# Patient Record
Sex: Female | Born: 1990 | Hispanic: Yes | Smoking: Never smoker
Health system: Southern US, Community
[De-identification: ages and names within clinical notes are randomized; demographics above are authoritative.]

## PROBLEM LIST (undated history)

## (undated) ENCOUNTER — Ambulatory Visit: Discharge status: Absent without leave | Payer: Managed Care, Other (non HMO)

## (undated) DIAGNOSIS — Z789 Other specified health status: Secondary | ICD-10-CM

## (undated) DIAGNOSIS — O24419 Gestational diabetes mellitus in pregnancy, unspecified control: Secondary | ICD-10-CM

## (undated) HISTORY — PX: NO PAST SURGERIES: SHX2092

## (undated) HISTORY — DX: Gestational diabetes mellitus in pregnancy, unspecified control: O24.419

---

## 2012-11-08 LAB — OB RESULTS CONSOLE GC/CHLAMYDIA
Chlamydia: NEGATIVE
GC PROBE AMP, GENITAL: NEGATIVE

## 2012-11-08 LAB — OB RESULTS CONSOLE ANTIBODY SCREEN: Antibody Screen: NEGATIVE

## 2012-11-08 LAB — OB RESULTS CONSOLE ABO/RH: RH TYPE: POSITIVE

## 2012-11-08 LAB — OB RESULTS CONSOLE HEPATITIS B SURFACE ANTIGEN: Hepatitis B Surface Ag: NEGATIVE

## 2012-11-08 LAB — OB RESULTS CONSOLE RUBELLA ANTIBODY, IGM: RUBELLA: IMMUNE

## 2012-11-08 LAB — OB RESULTS CONSOLE RPR: RPR: NONREACTIVE

## 2012-11-08 LAB — OB RESULTS CONSOLE HIV ANTIBODY (ROUTINE TESTING): HIV: NONREACTIVE

## 2012-12-20 ENCOUNTER — Inpatient Hospital Stay (HOSPITAL_COMMUNITY): Admission: AD | Admit: 2012-12-20 | Payer: Self-pay | Source: Ambulatory Visit | Admitting: Obstetrics & Gynecology

## 2013-04-04 NOTE — L&D Delivery Note (Signed)
Delivery Note At 4:28 PM a healthy female was delivered via Vaginal, Spontaneous Delivery (Presentation: ; Occiput Anterior).  APGAR: 8, 9; weight 8 lb 4.5 oz (3755 g).   Placenta status: Intact, Spontaneous.  Cord: 3 vessels with the following complications: None.  Cord pH: na  Anesthesia: Epidural  Episiotomy: None Lacerations: 1st degree Suture Repair: 3.0 monocryl Est. Blood Loss (mL): 500  Upon arrival patient was complete and pushing. She pushed with good maternal effort to deliver a healthy girl. Baby delivered without difficulty, was noted to have good tone and place on maternal abdomen for oral suctioning, drying and stimulation. Delayed cord clamping performed and cut by FOB. Placenta delivered intact with 3V cord. Vaginal canal and perineum was inspected and 1st degree vaginal laceration was found and repaired in the usual fashion by Dr Gayla DossJoyner which was hemostatic on completion. Pitocin was started and uterus massaged until bleeding slowed. Counts of sharps, instruments, and lap pads were all correct.    Mom to postpartum.  Baby to Couplet care / Skin to Skin.  Wenda LowJoyner, James 06/20/2013, 5:28 PM   I was present for and supervised the delivery of this newborn. I agree with above.   Chayanne Speir, Redmond BasemanKELI L, MD

## 2013-05-20 LAB — OB RESULTS CONSOLE GBS: GBS: NEGATIVE

## 2013-05-20 LAB — OB RESULTS CONSOLE GC/CHLAMYDIA
Chlamydia: NEGATIVE
Gonorrhea: NEGATIVE

## 2013-06-17 ENCOUNTER — Other Ambulatory Visit (HOSPITAL_COMMUNITY): Payer: Self-pay | Admitting: Nurse Practitioner

## 2013-06-17 DIAGNOSIS — O480 Post-term pregnancy: Secondary | ICD-10-CM

## 2013-06-19 ENCOUNTER — Inpatient Hospital Stay (HOSPITAL_COMMUNITY)
Admission: AD | Admit: 2013-06-19 | Discharge: 2013-06-21 | DRG: 775 | Disposition: A | Discharge status: Home / Self Care | Payer: Medicaid Other | Source: Ambulatory Visit | Attending: Obstetrics & Gynecology | Admitting: Obstetrics & Gynecology

## 2013-06-19 ENCOUNTER — Encounter (HOSPITAL_COMMUNITY): Payer: Self-pay | Admitting: *Deleted

## 2013-06-19 ENCOUNTER — Ambulatory Visit (HOSPITAL_COMMUNITY)
Admission: RE | Admit: 2013-06-19 | Discharge: 2013-06-19 | Disposition: A | Discharge status: Home / Self Care | Payer: Medicaid Other | Source: Ambulatory Visit | Attending: Nurse Practitioner | Admitting: Nurse Practitioner

## 2013-06-19 DIAGNOSIS — Z3689 Encounter for other specified antenatal screening: Secondary | ICD-10-CM | POA: Insufficient documentation

## 2013-06-19 DIAGNOSIS — O480 Post-term pregnancy: Secondary | ICD-10-CM | POA: Insufficient documentation

## 2013-06-19 HISTORY — DX: Other specified health status: Z78.9

## 2013-06-19 LAB — CBC
HCT: 41.1 % (ref 36.0–46.0)
Hemoglobin: 14.9 g/dL (ref 12.0–15.0)
MCH: 30.5 pg (ref 26.0–34.0)
MCHC: 36.3 g/dL — AB (ref 30.0–36.0)
MCV: 84.2 fL (ref 78.0–100.0)
Platelets: 139 10*3/uL — ABNORMAL LOW (ref 150–400)
RBC: 4.88 MIL/uL (ref 3.87–5.11)
RDW: 13.6 % (ref 11.5–15.5)
WBC: 7.6 10*3/uL (ref 4.0–10.5)

## 2013-06-19 LAB — ABO/RH: ABO/RH(D): A POS

## 2013-06-19 LAB — RPR: RPR: NONREACTIVE

## 2013-06-19 LAB — TYPE AND SCREEN
ABO/RH(D): A POS
Antibody Screen: NEGATIVE

## 2013-06-19 MED ORDER — TERBUTALINE SULFATE 1 MG/ML IJ SOLN: 0.2500 mg | Freq: Once | INTRAMUSCULAR | Status: AC | PRN

## 2013-06-19 MED ORDER — IBUPROFEN 600 MG PO TABS: 600.0000 mg | ORAL_TABLET | Freq: Four times a day (QID) | ORAL | Status: DC | PRN

## 2013-06-19 MED ORDER — ONDANSETRON HCL 4 MG/2ML IJ SOLN: 4.0000 mg | Freq: Four times a day (QID) | INTRAMUSCULAR | Status: DC | PRN

## 2013-06-19 MED ORDER — LIDOCAINE HCL (PF) 1 % IJ SOLN
30.0000 mL | INTRAMUSCULAR | Status: DC | PRN
  Filled 2013-06-19: qty 30

## 2013-06-19 MED ORDER — OXYTOCIN 40 UNITS IN LACTATED RINGERS INFUSION - SIMPLE MED
1.0000 m[IU]/min | INTRAVENOUS | Status: DC
  Administered 2013-06-19: 2 m[IU]/min via INTRAVENOUS
  Filled 2013-06-19: qty 1000

## 2013-06-19 MED ORDER — LACTATED RINGERS IV SOLN: 500.0000 mL | INTRAVENOUS | Status: DC | PRN

## 2013-06-19 MED ORDER — OXYCODONE-ACETAMINOPHEN 5-325 MG PO TABS: 1.0000 | ORAL_TABLET | ORAL | Status: DC | PRN

## 2013-06-19 MED ORDER — OXYTOCIN 40 UNITS IN LACTATED RINGERS INFUSION - SIMPLE MED
62.5000 mL/h | INTRAVENOUS | Status: DC
  Administered 2013-06-20: 62.5 mL/h via INTRAVENOUS

## 2013-06-19 MED ORDER — FENTANYL CITRATE 0.05 MG/ML IJ SOLN
100.0000 ug | INTRAMUSCULAR | Status: DC | PRN
  Administered 2013-06-20: 100 ug via INTRAVENOUS
  Filled 2013-06-19: qty 2

## 2013-06-19 MED ORDER — MISOPROSTOL 25 MCG QUARTER TABLET
25.0000 ug | ORAL_TABLET | ORAL | Status: DC | PRN
  Administered 2013-06-19: 25 ug via VAGINAL
  Filled 2013-06-19: qty 1
  Filled 2013-06-19: qty 0.25

## 2013-06-19 MED ORDER — LACTATED RINGERS IV SOLN
INTRAVENOUS | Status: DC
  Administered 2013-06-19 – 2013-06-20 (×4): via INTRAVENOUS

## 2013-06-19 MED ORDER — OXYTOCIN BOLUS FROM INFUSION
500.0000 mL | INTRAVENOUS | Status: DC
  Administered 2013-06-20: 500 mL via INTRAVENOUS

## 2013-06-19 MED ORDER — ACETAMINOPHEN 325 MG PO TABS
650.0000 mg | ORAL_TABLET | ORAL | Status: DC | PRN
  Administered 2013-06-20: 650 mg via ORAL
  Filled 2013-06-19: qty 2

## 2013-06-19 MED ORDER — CITRIC ACID-SODIUM CITRATE 334-500 MG/5ML PO SOLN: 30.0000 mL | ORAL | Status: DC | PRN

## 2013-06-19 NOTE — H&P (Signed)
Dorothy [name] is a 23 y.o. female G1P0 with IUP at [redacted]w[redacted]d presenting for IOL due to non-reassuring BPP 4/8. Pt states she has been having irregular contractions, associated with none vaginal bleeding.  Membranes are intact, with active fetal movement.    [facility]  Prenatal History/Complications: Non-reassuring BPP 4/8 on 3/18  Past Medical History: Past Medical History  Diagnosis Date  . Medical history non-contributory     Past Surgical History: Past Surgical History  Procedure Laterality Date  . No past surgeries      Obstetrical History: OB History   Grav Para Term Preterm Abortions TAB SAB Ect Mult Living   1               Social History: History   Social History  . Marital Status: Single    Spouse Name: N/A    Number of Children: N/A  . Years of Education: N/A   Social History Main Topics  . Smoking status: Never Smoker   . Smokeless tobacco: Never Used  . Alcohol Use: No  . Drug Use: No  . Sexual Activity: Yes   Other Topics Concern  . None   Social History Narrative  . None    Family History: No family history on file.  Allergies: No Known Allergies  Prescriptions prior to admission  Medication Sig Dispense Refill  . Prenatal Vit-Fe Fumarate-FA (PRENATAL MULTIVITAMIN) TABS tablet Take 1 tablet by mouth daily at 12 noon.        Review of Systems: Negative unless otherwise stated in History above  Physicial Temperature 98.1 F (36.7 C), temperature source Oral, resp. rate 20, height 5' (1.524 m), weight 76.204 kg (168 lb). General appearance: alert, cooperative and no distress Lungs: clear to auscultation bilaterally Heart: regular rate and rhythm Abdomen: soft, non-tender; bowel sounds normal Extremities: Homans sign is negative, no sign of DVT Presentation: cephalic Fetal monitoringBaseline: 140 bpm, Variability: Good {> 6 bpm), Accelerations: Reactive and Decelerations: Absent Uterine activity: Irregular  contractions  Dilation: 1 Effacement (%): 50 Station: -3 Exam by:: Dr Ike Bene  Prenatal labs: ABO, Rh: A/Positive/-- (08/07 0000) Antibody: Negative (08/07 0000) Rubella:   RPR: Nonreactive (08/07 0000)  HBsAg: Negative (08/07 0000)  HIV: Non-reactive (08/07 0000)  GBS: Negative (02/16 0000)  GTT: wnl  Prenatal Transfer Tool  Maternal Diabetes: No Genetic Screening: Declined Maternal Ultrasounds/Referrals: Normal Fetal Ultrasounds or other Referrals:  None Maternal Substance Abuse:  No Significant Maternal Medications:  None Significant Maternal Lab Results: Lab values include: Group B Strep negative  Results for orders placed during the hospital encounter of 06/19/13 (from the past 24 hour(s))  CBC   Collection Time    06/19/13 11:25 AM      Result Value Ref Range   WBC 7.6  4.0 - 10.5 K/uL   RBC 4.88  3.87 - 5.11 MIL/uL   Hemoglobin 14.9  12.0 - 15.0 g/dL   HCT 16.1  09.6 - 04.5 %   MCV 84.2  78.0 - 100.0 fL   MCH 30.5  26.0 - 34.0 pg   MCHC 36.3 (*) 30.0 - 36.0 g/dL   RDW 40.9  81.1 - 91.4 %   Platelets 139 (*) 150 - 400 K/uL    Assessment: Dorothy [name] is a 23 y.o. G1P0 at [redacted]w[redacted]d by here for IOL due to [facility] 4/8. FHT Cat on arrival; will continue to monitor.   #Labor:expectant management #Pain: Epidural on request #FWB: Cat 1; Continuous monitoring due to  BPP 4/8 on 06/19/13 #ID:  GBS negative #Feeding: Breast #MOC:Unsure #Circ:  NA - female  Wenda LowJames Joyner MD [facility] PGY-1 06/19/2013, 12:38 PM  I spoke with and examined patient and agree with resident's note and plan of care.  Tawana ScaleMichael Ryan Vibha Ferdig, MD OB Fellow 06/19/2013 7:27 PM

## 2013-06-19 NOTE — Progress Notes (Signed)
Dorothy SpecterMaria [name] is a 23 y.o. G1P0 at 1542w4d by ultrasound [facility]  Subjective: Doing well; No complaints;   Objective: BP 111/66  Pulse 91  Temp(Src) 97.7 F (36.5 C) (Oral)  Resp 20  Ht 5' (1.524 m)  Wt 76.204 kg (168 lb)  BMI 32.81 kg/m2     FHT:  FHR: 140 bpm, variability: moderate,  accelerations:  Abscent,  decelerations:  Absent Dilation: 4.5 Effacement (%): 60 Cervical Position: Posterior Station: -3;-2 Presentation: Vertex Exam by:: Dr Margo AyeHall  Labs: Lab Results  Component Value Date   WBC 7.6 06/19/2013   HGB 14.9 06/19/2013   HCT 41.1 06/19/2013   MCV 84.2 06/19/2013   PLT 139* 06/19/2013    Assessment / Plan: IOL for BPP 4/8 - NST reactive  Labor: continuing PIT s/p FB & cytotec x 1;  Preeclampsia:  na Fetal Wellbeing:  Category I Pain Control:  Labor support without medications I/D:  n/a Anticipated MOD:  NSVD  [name], Avri Paiva C 06/19/2013, 11:52 PM

## 2013-06-19 NOTE — Progress Notes (Signed)
Dorothy SpecterMaria [name] is a 23 y.o. G1P0 at 5361w4d by ultrasound [facility]  Subjective: Doing well; No complaints  Objective: BP 120/76  Pulse 84  Temp(Src) 98.2 F (36.8 C) (Axillary)  Resp 20  Ht 5' (1.524 m)  Wt 76.204 kg (168 lb)  BMI 32.81 kg/m2     FHT:  FHR: 140 bpm, variability: moderate,  accelerations:  Abscent,  decelerations:  Absent UC:   regular, every 3 minutes SVE:   Dilation: 5 Effacement (%): 60 Station: -2 Exam by:: Dr Ike Benedom  Labs: Lab Results  Component Value Date   WBC 7.6 06/19/2013   HGB 14.9 06/19/2013   HCT 41.1 06/19/2013   MCV 84.2 06/19/2013   PLT 139* 06/19/2013    Assessment / Plan: IOL for BPP 4/8 - NST reactive  Labor: Progressing normally and Starting PIT s/p FB & cytotec x 1 Preeclampsia:  na Fetal Wellbeing:  Category I Pain Control:  Labor support without medications I/D:  n/a Anticipated MOD:  NSVD  Dorothy [name], Dorothy [name] 06/19/2013, 7:08 PM

## 2013-06-19 NOTE — Progress Notes (Signed)
Dorothy [name] is a 23 y.o. G1P0 at 4979w4d by ultrasound admitted for [facility]  Subjective: Doing well; Increase pressure with contractions, but not asking for pain medications at this time.   Objective: BP 120/76  Pulse 84  Temp(Src) 98.2 F (36.8 C) (Axillary)  Resp 20  Ht 5' (1.524 m)  Wt 76.204 kg (168 lb)  BMI 32.81 kg/m2     FHT:  FHR: 150 bpm, variability: moderate,  accelerations:  Present,  decelerations:  Absent UC:   regular, every 3 minutes SVE:   Dilation: 1 Effacement (%): 50 Station: -3 Exam by:: Ace GinsL. Cresenzo, RN  Labs: Lab Results  Component Value Date   WBC 7.6 06/19/2013   HGB 14.9 06/19/2013   HCT 41.1 06/19/2013   MCV 84.2 06/19/2013   PLT 139* 06/19/2013    Assessment / Plan: IOL for BPP 4/8 - NST reactive  Labor: Progressing normally and FB placed s/p cytotec x 1 Preeclampsia:  na Fetal Wellbeing:  Category I Pain Control:  Labor support without medications I/D:  n/a Anticipated MOD:  NSVD  Dorothy [name], Dorothy [name] 06/19/2013, 6:16 PM

## 2013-06-20 ENCOUNTER — Inpatient Hospital Stay (HOSPITAL_COMMUNITY): Payer: Medicaid Other | Admitting: Anesthesiology

## 2013-06-20 ENCOUNTER — Encounter (HOSPITAL_COMMUNITY): Payer: Self-pay | Admitting: *Deleted

## 2013-06-20 ENCOUNTER — Encounter (HOSPITAL_COMMUNITY): Payer: Medicaid Other | Admitting: Anesthesiology

## 2013-06-20 LAB — CBC
HCT: 39.1 % (ref 36.0–46.0)
HEMATOCRIT: 38.3 % (ref 36.0–46.0)
Hemoglobin: 14.2 g/dL (ref 12.0–15.0)
Hemoglobin: 13.9 g/dL (ref 12.0–15.0)
MCH: 30.3 pg (ref 26.0–34.0)
MCH: 30.6 pg (ref 26.0–34.0)
MCHC: 36.3 g/dL — AB (ref 30.0–36.0)
MCHC: 36.3 g/dL — AB (ref 30.0–36.0)
MCV: 83.5 fL (ref 78.0–100.0)
MCV: 84.4 fL (ref 78.0–100.0)
PLATELETS: 121 10*3/uL — AB (ref 150–400)
Platelets: 141 10*3/uL — ABNORMAL LOW (ref 150–400)
RBC: 4.68 MIL/uL (ref 3.87–5.11)
RBC: 4.54 MIL/uL (ref 3.87–5.11)
RDW: 13.7 % (ref 11.5–15.5)
RDW: 13.6 % (ref 11.5–15.5)
WBC: 10 10*3/uL (ref 4.0–10.5)
WBC: 18.2 10*3/uL — ABNORMAL HIGH (ref 4.0–10.5)

## 2013-06-20 MED ORDER — PHENYLEPHRINE 40 MCG/ML (10ML) SYRINGE FOR IV PUSH (FOR BLOOD PRESSURE SUPPORT)
80.0000 ug | PREFILLED_SYRINGE | INTRAVENOUS | Status: DC | PRN
Start: 2013-06-20 — End: 2013-06-20
  Filled 2013-06-20: qty 2

## 2013-06-20 MED ORDER — LACTATED RINGERS IV SOLN
500.0000 mL | Freq: Once | INTRAVENOUS | Status: AC
  Administered 2013-06-20: 500 mL via INTRAVENOUS

## 2013-06-20 MED ORDER — GENTAMICIN SULFATE 40 MG/ML IJ SOLN
140.0000 mg | Freq: Three times a day (TID) | INTRAVENOUS | Status: DC
  Filled 2013-06-20 (×2): qty 3.5

## 2013-06-20 MED ORDER — WITCH HAZEL-GLYCERIN EX PADS: 1.0000 "application " | MEDICATED_PAD | CUTANEOUS | Status: DC | PRN

## 2013-06-20 MED ORDER — SIMETHICONE 80 MG PO CHEW: 80.0000 mg | CHEWABLE_TABLET | ORAL | Status: DC | PRN

## 2013-06-20 MED ORDER — PRENATAL MULTIVITAMIN CH
1.0000 | ORAL_TABLET | Freq: Every day | ORAL | Status: DC
  Administered 2013-06-21: 1 via ORAL
  Filled 2013-06-20: qty 1

## 2013-06-20 MED ORDER — FENTANYL 2.5 MCG/ML BUPIVACAINE 1/10 % EPIDURAL INFUSION (WH - ANES)
INTRAMUSCULAR | Status: DC | PRN
  Administered 2013-06-20: 14 mL/h via EPIDURAL

## 2013-06-20 MED ORDER — EPHEDRINE 5 MG/ML INJ
10.0000 mg | INTRAVENOUS | Status: DC | PRN
Start: 2013-06-20 — End: 2013-06-20
  Filled 2013-06-20: qty 2
  Filled 2013-06-20: qty 4

## 2013-06-20 MED ORDER — LIDOCAINE HCL (PF) 1 % IJ SOLN
INTRAMUSCULAR | Status: DC | PRN
  Administered 2013-06-20 (×2): 4 mL

## 2013-06-20 MED ORDER — DIPHENHYDRAMINE HCL 50 MG/ML IJ SOLN: 12.5000 mg | INTRAMUSCULAR | Status: DC | PRN

## 2013-06-20 MED ORDER — DIPHENHYDRAMINE HCL 25 MG PO CAPS: 25.0000 mg | ORAL_CAPSULE | Freq: Four times a day (QID) | ORAL | Status: DC | PRN

## 2013-06-20 MED ORDER — PHENYLEPHRINE 40 MCG/ML (10ML) SYRINGE FOR IV PUSH (FOR BLOOD PRESSURE SUPPORT)
80.0000 ug | PREFILLED_SYRINGE | INTRAVENOUS | Status: DC | PRN
  Filled 2013-06-20: qty 2
  Filled 2013-06-20: qty 10

## 2013-06-20 MED ORDER — BENZOCAINE-MENTHOL 20-0.5 % EX AERO
1.0000 "application " | INHALATION_SPRAY | CUTANEOUS | Status: DC | PRN
  Filled 2013-06-20: qty 56

## 2013-06-20 MED ORDER — EPHEDRINE 5 MG/ML INJ
10.0000 mg | INTRAVENOUS | Status: DC | PRN
  Filled 2013-06-20: qty 2

## 2013-06-20 MED ORDER — SODIUM CHLORIDE 0.9 % IV SOLN
2.0000 g | Freq: Four times a day (QID) | INTRAVENOUS | Status: DC
  Filled 2013-06-20 (×3): qty 2000

## 2013-06-20 MED ORDER — TETANUS-DIPHTH-ACELL PERTUSSIS 5-2.5-18.5 LF-MCG/0.5 IM SUSP: 0.5000 mL | Freq: Once | INTRAMUSCULAR | Status: DC

## 2013-06-20 MED ORDER — ZOLPIDEM TARTRATE 5 MG PO TABS: 5.0000 mg | ORAL_TABLET | Freq: Every evening | ORAL | Status: DC | PRN

## 2013-06-20 MED ORDER — SENNOSIDES-DOCUSATE SODIUM 8.6-50 MG PO TABS
2.0000 | ORAL_TABLET | ORAL | Status: DC
  Filled 2013-06-20: qty 2

## 2013-06-20 MED ORDER — IBUPROFEN 600 MG PO TABS
600.0000 mg | ORAL_TABLET | Freq: Four times a day (QID) | ORAL | Status: DC
Start: 2013-06-20 — End: 2013-06-21
  Administered 2013-06-20 – 2013-06-21 (×5): 600 mg via ORAL
  Filled 2013-06-20 (×5): qty 1

## 2013-06-20 MED ORDER — FENTANYL 2.5 MCG/ML BUPIVACAINE 1/10 % EPIDURAL INFUSION (WH - ANES)
14.0000 mL/h | INTRAMUSCULAR | Status: DC | PRN
  Administered 2013-06-20: 14 mL/h via EPIDURAL
  Filled 2013-06-20: qty 125

## 2013-06-20 MED ORDER — FENTANYL 2.5 MCG/ML BUPIVACAINE 1/10 % EPIDURAL INFUSION (WH - ANES)
14.0000 mL/h | INTRAMUSCULAR | Status: DC | PRN
  Filled 2013-06-20: qty 125

## 2013-06-20 MED ORDER — OXYCODONE-ACETAMINOPHEN 5-325 MG PO TABS: 1.0000 | ORAL_TABLET | ORAL | Status: DC | PRN

## 2013-06-20 MED ORDER — DIBUCAINE 1 % RE OINT: 1.0000 "application " | TOPICAL_OINTMENT | RECTAL | Status: DC | PRN

## 2013-06-20 MED ORDER — LANOLIN HYDROUS EX OINT: TOPICAL_OINTMENT | CUTANEOUS | Status: DC | PRN

## 2013-06-20 MED ORDER — ONDANSETRON HCL 4 MG/2ML IJ SOLN: 4.0000 mg | INTRAMUSCULAR | Status: DC | PRN

## 2013-06-20 MED ORDER — ONDANSETRON HCL 4 MG PO TABS: 4.0000 mg | ORAL_TABLET | ORAL | Status: DC | PRN

## 2013-06-20 NOTE — Progress Notes (Signed)
Dorothy SpecterMaria [name] is a 23 y.o. G1P0 at 3334w4d by ultrasound admitted [facility]  Subjective: Has felt increasing pain, requested fentanyl and will probably want epidural too  Objective: BP 114/78  Pulse 99  Temp(Src) 97.7 F (36.5 C) (Oral)  Resp 20  Ht 5' (1.524 m)  Wt 76.204 kg (168 lb)  BMI 32.81 kg/m2     FHT:  FHR: 150 bpm, variability: moderate,  accelerations:  Present,  decelerations:  Absent Dilation: 5 Effacement (%): 80 Cervical Position: Middle Station: -2 Presentation: Vertex Exam by:: Larose KellsK Fraley RN  Labs: Lab Results  Component Value Date   WBC 10.0 06/20/2013   HGB 14.2 06/20/2013   HCT 39.1 06/20/2013   MCV 83.5 06/20/2013   PLT 121* 06/20/2013    Assessment / Plan: IOL for BPP 4/8 - NST reactive  Labor: continuing PIT s/p FB & cytotec x 1; Ctx more regular, 90sec; cervical protraction of dilation Preeclampsia:  no signs or symptoms of toxicity Fetal Wellbeing:  Category I Pain Control:  Labor support without medications I/D:  GBS neg Anticipated MOD:  NSVD  [name], Vrinda Heckstall C 06/20/2013, 1:34 AM

## 2013-06-20 NOTE — Anesthesia Procedure Notes (Signed)
Epidural Patient location during procedure: OB Start time: 06/20/2013 3:01 AM End time: 06/20/2013 3:16 AM  Staffing Anesthesiologist: Lewie LoronGERMEROTH, Aline Wesche R Performed by: anesthesiologist   Preanesthetic Checklist Completed: patient identified, pre-op evaluation, timeout performed, IV checked, risks and benefits discussed and monitors and equipment checked  Epidural Patient position: sitting Prep: site prepped and draped and DuraPrep Patient monitoring: heart rate Approach: midline Location: L3-L4 Injection technique: LOR air and LOR saline  Needle:  Needle type: Tuohy  Needle gauge: 17 G Needle length: 9 cm Needle insertion depth: 6 cm Catheter type: closed end flexible Catheter size: 19 Gauge Catheter at skin depth: 12 cm Test dose: negative  Assessment Sensory level: T8 Events: blood aspirated, injection not painful, no injection resistance, negative IV test and no paresthesia  Additional Notes 1st attempt at L2-3, blood aspirated. 2nd, L3-4 without issue.Reason for block:procedure for pain

## 2013-06-20 NOTE — Anesthesia Preprocedure Evaluation (Signed)
Anesthesia Evaluation  Patient identified by MRN, date of birth, ID band Patient awake    Reviewed: Allergy & Precautions, H&P , NPO status , Patient's Chart, lab work & pertinent test results  Airway Mallampati: II TM Distance: >3 FB     Dental  (+) Dental Advisory Given   Pulmonary neg pulmonary ROS,          Cardiovascular negative cardio ROS  Rhythm:Regular     Neuro/Psych negative neurological ROS  negative psych ROS   GI/Hepatic negative GI ROS, Neg liver ROS,   Endo/Other  negative endocrine ROS  Renal/GU negative Renal ROS     Musculoskeletal negative musculoskeletal ROS (+)   Abdominal   Peds  Hematology negative hematology ROS (+)   Anesthesia Other Findings   Reproductive/Obstetrics (+) Pregnancy                           Anesthesia Physical Anesthesia Plan  ASA: II  Anesthesia Plan: Epidural   Post-op Pain Management:    Induction:   Airway Management Planned:   Additional Equipment:   Intra-op Plan:   Post-operative Plan:   Informed Consent: I have reviewed the patients History and Physical, chart, labs and discussed the procedure including the risks, benefits and alternatives for the proposed anesthesia with the patient or authorized representative who has indicated his/her understanding and acceptance.     Plan Discussed with:   Anesthesia Plan Comments:         Anesthesia Quick Evaluation  

## 2013-06-20 NOTE — Progress Notes (Signed)
Dorothy [name] is a 23 y.o. G1P0 at 3918w4d by ultrasound admitted for induction of labor [facility]  Subjective: Received epidural, and is feeling well  Objective: BP 114/65  Pulse 117  Temp(Src) 99 F (37.2 [name]) (Oral)  Resp 18  Ht 5' (1.524 m)  Wt 76.204 kg (168 lb)  BMI 32.81 kg/m2  SpO2 96%     FHT:  FHR: 140 bpm, variability: moderate,  accelerations:  Present,  decelerations:  Absent Dilation: 7 Effacement (%): 100 Cervical Position: Middle Station: -1;0 Presentation: Vertex Exam by:: Larose KellsK Fraley RN  Labs: Lab Results  Component Value Date   WBC 10.0 06/20/2013   HGB 14.2 06/20/2013   HCT 39.1 06/20/2013   MCV 83.5 06/20/2013   PLT 121* 06/20/2013    Assessment / Plan: IOL for BPP 4/8 - NST reactive  Labor: continuing PIT s/p FB & cytotec x 1; Ctx Q90sec; Cervix progressing Preeclampsia:  no signs or symptoms of toxicity Fetal Wellbeing:  Category I Pain Control:  Epidural I/D:  GBS neg Anticipated MOD:  NSVD  Dorothy [name], Dorothy [name] 06/20/2013, 6:00 AM

## 2013-06-20 NOTE — Progress Notes (Signed)
Loletta SpecterMaria Ramirez-Hernandez is a 23 y.o. G1P0 at 4666w4d by ultrasound admitted for induction of labor [facility] with reactive NST  Subjective: No complaints; Epidural working well.   Objective: BP 124/81  Pulse 115  Temp(Src) 98.2 F (36.8 C) (Oral)  Resp 18  Ht 5' (1.524 m)  Wt 76.204 kg (168 lb)  BMI 32.81 kg/m2  SpO2 96%     FHT:  FHR: 140 bpm, variability: moderate,  accelerations:  Present,  decelerations:  Absent Dilation: 8 Effacement (%): 100 Cervical Position: Middle Station: +1 Presentation: Vertex Exam by:: Dr. Gayla DossJoyner  Labs: Lab Results  Component Value Date   WBC 10.0 06/20/2013   HGB 14.2 06/20/2013   HCT 39.1 06/20/2013   MCV 83.5 06/20/2013   PLT 121* 06/20/2013    Assessment / Plan: IOL for BPP 4/8 - NST reactive  Labor: AROM @ 10am; Continuing PIT s/p FB & cytotec x 1 Ctx Q90sec; Cervix progressing Preeclampsia:  no signs or symptoms of toxicity Fetal Wellbeing:  Category I Pain Control:  Epidural I/D:  GBS neg Anticipated MOD:  NSVD  Wenda LowJoyner, Tsuyako Jolley 06/20/2013, 10:20 AM

## 2013-06-21 LAB — CBC
HCT: 31.2 % — ABNORMAL LOW (ref 36.0–46.0)
Hemoglobin: 11.2 g/dL — ABNORMAL LOW (ref 12.0–15.0)
MCH: 30.3 pg (ref 26.0–34.0)
MCHC: 35.9 g/dL (ref 30.0–36.0)
MCV: 84.3 fL (ref 78.0–100.0)
PLATELETS: 115 10*3/uL — AB (ref 150–400)
RBC: 3.7 MIL/uL — ABNORMAL LOW (ref 3.87–5.11)
RDW: 13.9 % (ref 11.5–15.5)
WBC: 10.7 10*3/uL — ABNORMAL HIGH (ref 4.0–10.5)

## 2013-06-21 MED ORDER — IBUPROFEN 600 MG PO TABS
600.0000 mg | ORAL_TABLET | Freq: Four times a day (QID) | ORAL | Status: DC
Start: 2013-06-21 — End: 2015-08-06

## 2013-06-21 NOTE — Anesthesia Postprocedure Evaluation (Signed)
Anesthesia Post Note  Patient: Dorothy [name]  Procedure(s) Performed: * No procedures listed *  Anesthesia type: Epidural  Patient location: Mother/Baby  Post pain: Pain level controlled  Post assessment: Post-op Vital signs reviewed  Last Vitals:  Filed Vitals:   06/21/13 0556  BP: 100/67  Pulse: 87  Temp: 36.4 C  Resp: 18    Post vital signs: Reviewed  Level of consciousness:alert  Complications: No apparent anesthesia complications

## 2013-06-21 NOTE — Discharge Summary (Signed)
Obstetric Discharge Summary Reason for Admission: induction of labor Prenatal Procedures: ultrasound and BPP Intrapartum Procedures: spontaneous vaginal delivery Postpartum Procedures: none Complications-Operative and Postpartum: 1st degree vaginal lac Hemoglobin  Date Value Ref Range Status  06/21/2013 11.2* 12.0 - 15.0 g/dL Final     REPEATED TO VERIFY     DELTA CHECK NOTED     HCT  Date Value Ref Range Status  06/21/2013 31.2* 36.0 - 46.0 % Final   Discharge Diagnoses: Term pregnancy, delivered  Hospital Course:  Dorothy [name] is a 23 y.o. G1P1001 who presented with IOL d/t non-reassuring BPP of 4/8.  She had a uncomplicated SVD. She was able to ambulate, tolerate PO and void normally. She was discharged home with instructions for postpartum care.  She plans on breastfeeding and will be using condoms for contraception. [facility]  Delivery Note  At 4:28 PM a healthy female was delivered via Vaginal, Spontaneous Delivery (Presentation: ; Occiput Anterior). APGAR: 8, 9; weight 8 lb 4.5 oz (3755 g).  Placenta status: Intact, Spontaneous. Cord: 3 vessels with the following complications: None. Cord pH: na  Anesthesia: Epidural  Episiotomy: None  Lacerations: 1st degree  Suture Repair: 3.0 monocryl  Est. Blood Loss (mL): 500  Upon arrival patient was complete and pushing. She pushed with good maternal effort to deliver a healthy girl. Baby delivered without difficulty, was noted to have good tone and place on maternal abdomen for oral suctioning, drying and stimulation. Delayed cord clamping performed and cut by FOB. Placenta delivered intact with 3V cord. Vaginal canal and perineum was inspected and 1st degree vaginal laceration was found and repaired in the usual fashion by Dr Gayla DossJoyner which was hemostatic on completion. Pitocin was started and uterus massaged until bleeding slowed. Counts of sharps, instruments, and lap pads were all correct.   Mom to postpartum. Baby to Couplet care / Skin to Skin.  Wenda LowJoyner, James  06/20/2013, 5:28 PM  I was present for and supervised the delivery of this newborn. I agree with above.  Markesia Crilly, Redmond BasemanKELI L, MD   Physical Exam:  General: alert and cooperative Lochia: appropriate Uterine Fundus: firm DVT Evaluation: No evidence of DVT seen on physical exam.  Discharge Information: Date: 06/21/2013 Activity: pelvic rest Diet: routine Medications: Ibuprofen Baby feeding: plans to breastfeed Contraception: condoms Condition: stable Instructions: refer to practice specific booklet Discharge to: home   Newborn Data: Live born female  Birth Weight: 8 lb 4.5 oz (3755 g) APGAR: 8, 9  Home with mother pending consult with pediatrician.   Allyne GeeBrad Winkel PA-S  I have seen and examined this patient and agree with above documentation in the PA student's note.   Rulon AbideKeli Katielynn Horan, M.D. [facility] Fellow 06/21/2013 11:30 AM

## 2013-06-21 NOTE — Discharge Summary (Signed)
Attestation of Attending Supervision of Fellow: Evaluation and management procedures were performed by the Fellow under my supervision and collaboration.  I have reviewed the Fellow's note and chart, and I agree with the management and plan.    

## 2013-06-21 NOTE — Discharge Instructions (Signed)

## 2013-06-25 ENCOUNTER — Inpatient Hospital Stay (HOSPITAL_COMMUNITY): Admission: RE | Admit: 2013-06-25 | Payer: Self-pay | Source: Ambulatory Visit

## 2013-06-25 NOTE — Progress Notes (Signed)
Post discharge chart review completed.  

## 2014-02-03 ENCOUNTER — Encounter (HOSPITAL_COMMUNITY): Payer: Self-pay | Admitting: *Deleted

## 2014-12-04 IMAGING — US US FETAL BPP W/O NONSTRESS
1 series · 13 of 16 positions shown · non-contrast
Comparison: none

[Series 1: us fetal bpp w/o nonstress · non-contrast · 16 acquisitions, 13 frames shown]
[im 1/16]
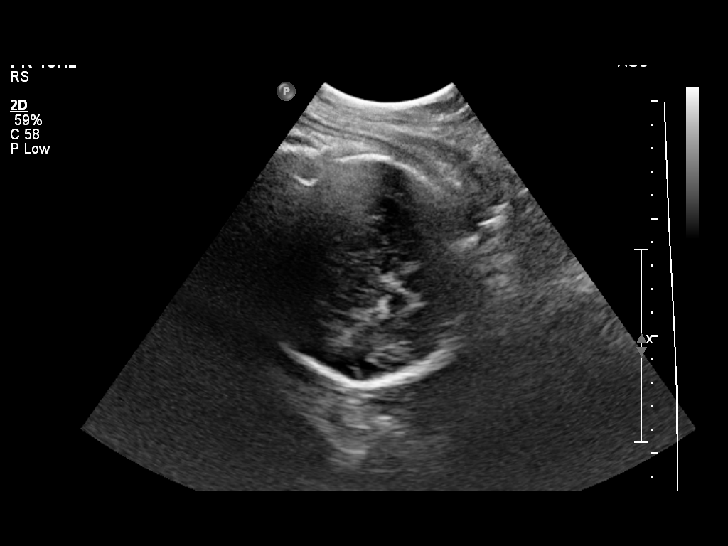
[im 2/16]
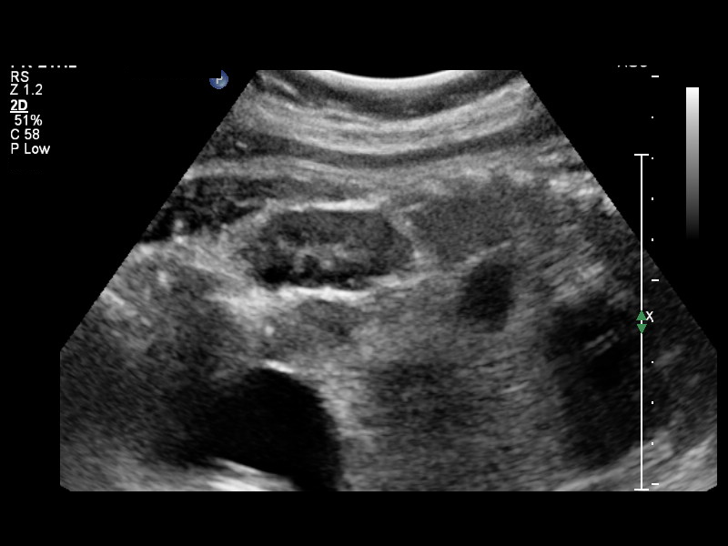
[im 4/16]
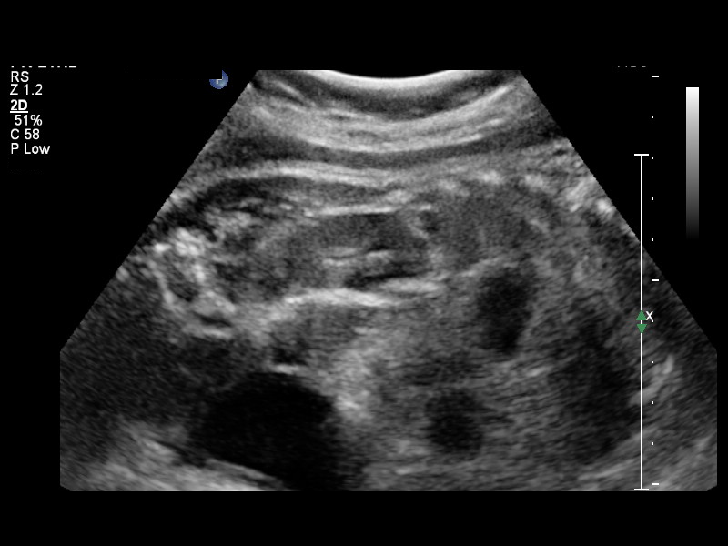
[im 5/16]
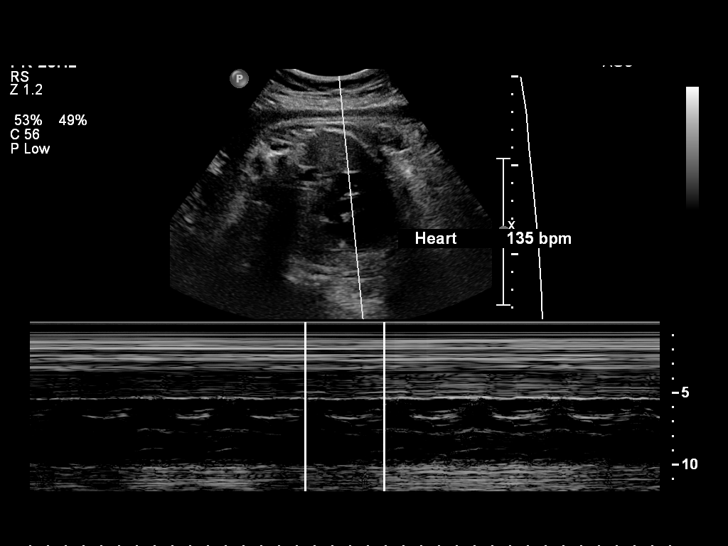
[im 6/16]
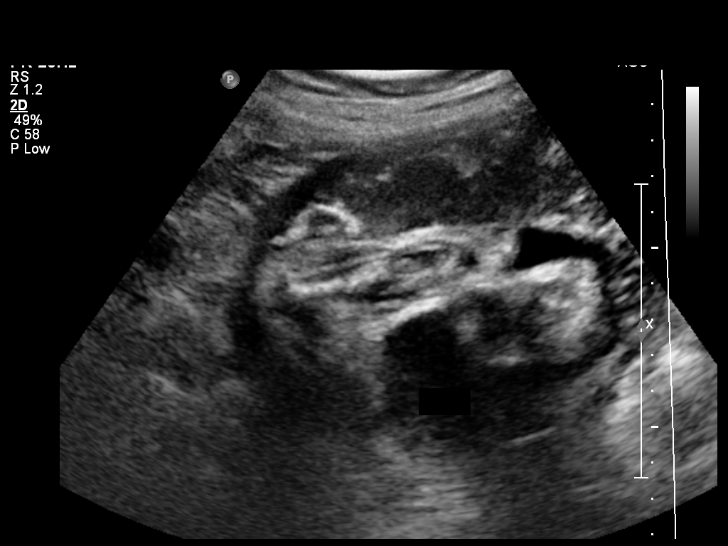
[im 7/16]
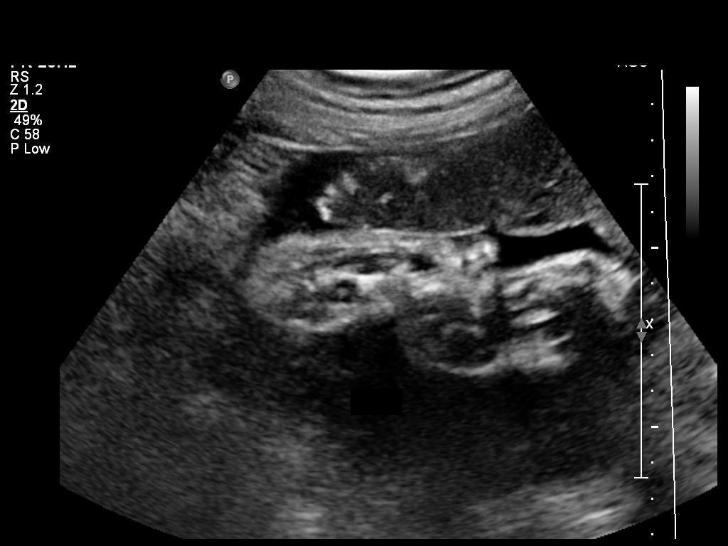
[im 9/16]
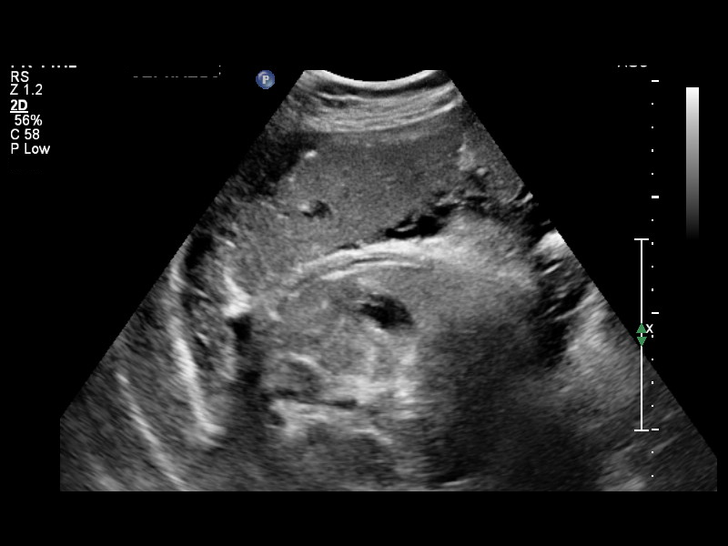
[im 10/16]
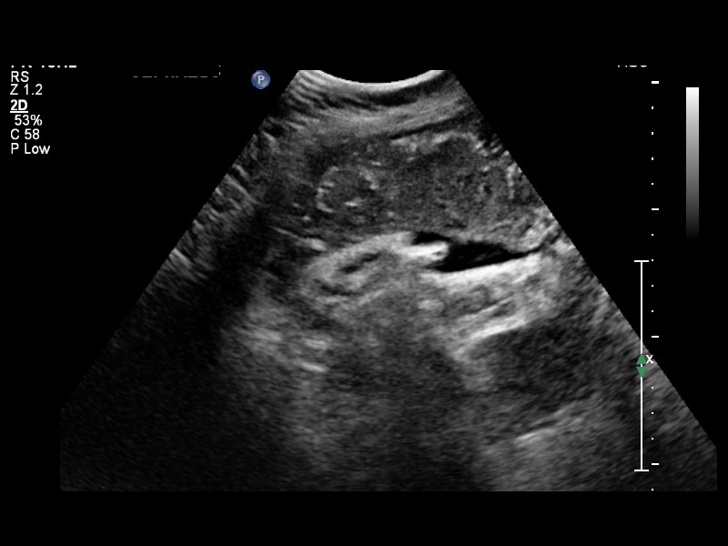
[im 11/16]
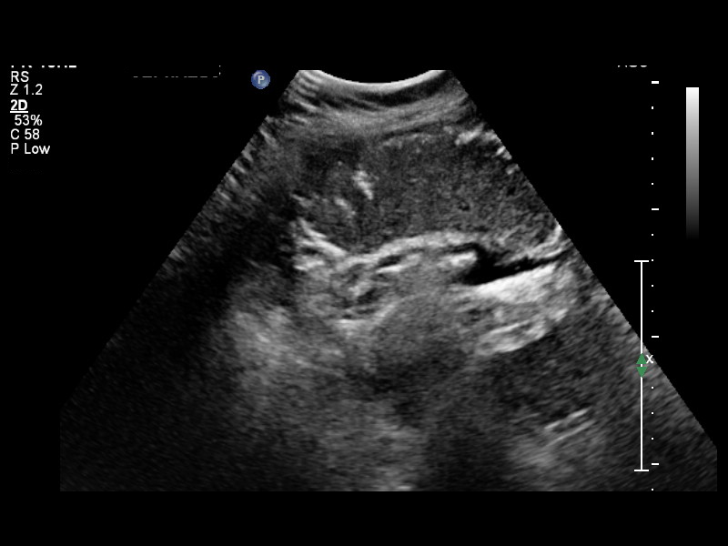
[im 12/16]
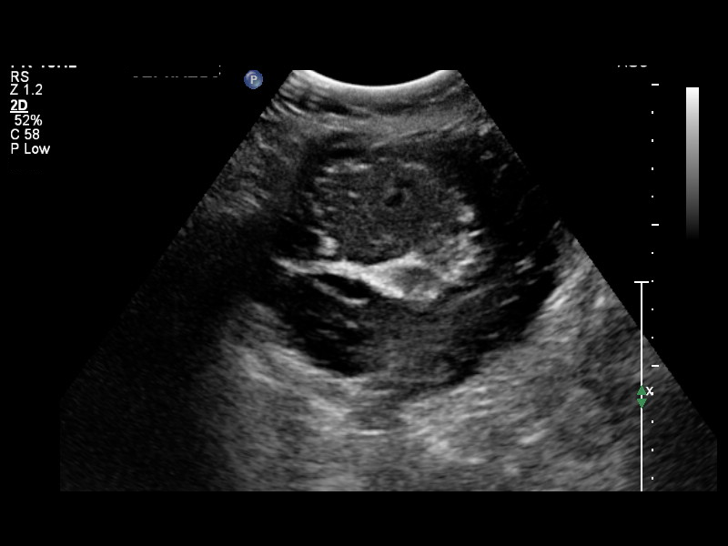
[im 13/16]
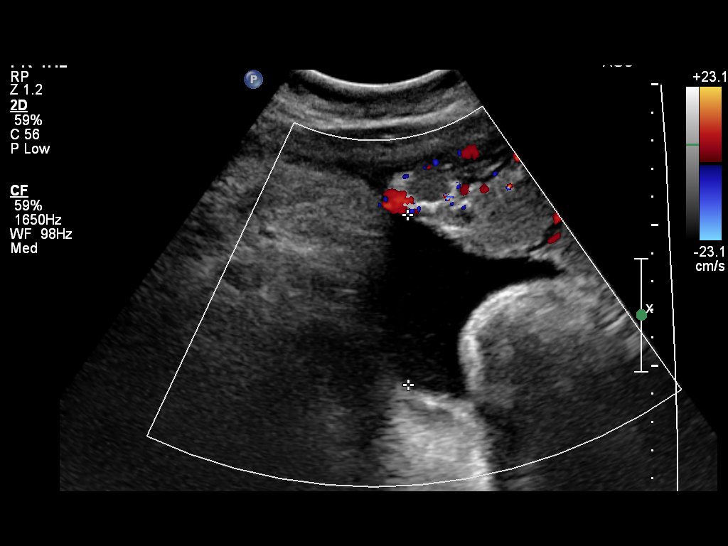
[im 15/16]
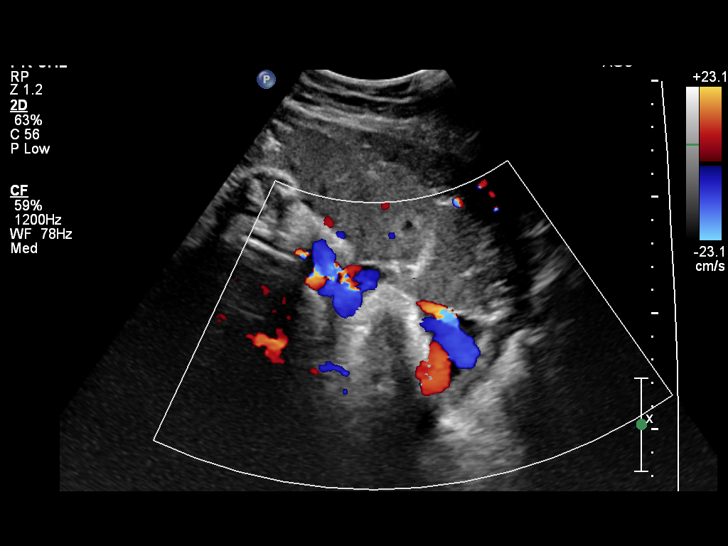
[im 16/16]
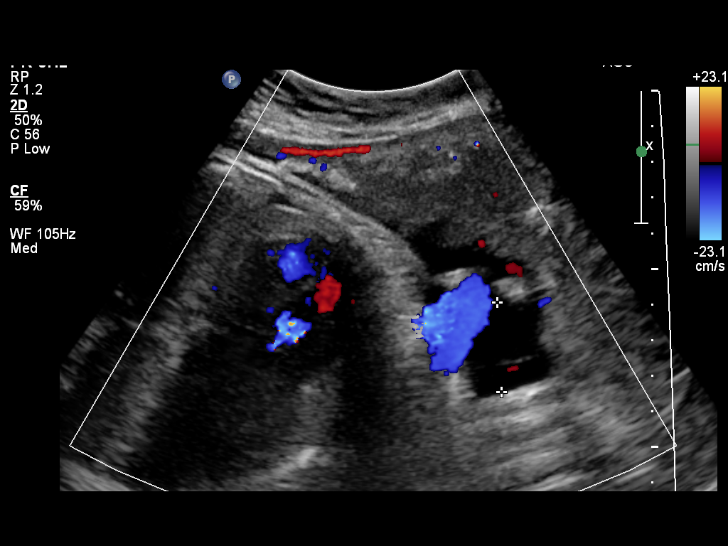

[13 of 16 positions shown; findings below may reference images not displayed]

OBSTETRICS REPORT
                      (Signed Final 06/19/2013 [DATE])

             NITSCHMANN

Service(s) Provided

Indications

 Postdate pregnancy (40-42 weeks)
Fetal Evaluation

 Num Of Fetuses:    1
 Fetal Heart Rate:  135                          bpm
 Cardiac Activity:  Observed
 Presentation:      Cephalic

 Amniotic Fluid
 AFI FV:      Subjectively within normal limits
 AFI Sum:     13.48   cm       59  %Tile     Larg Pckt:    6.02  cm
 RUQ:   6.02    cm   RLQ:    2.5    cm    LUQ:   4.96    cm
Biophysical Evaluation

 Amniotic F.V:   Pocket => 2 cm two         F. Tone:        Not Observed
                 planes
 F. Movement:    Not Observed               Score:          [DATE]
 F. Breathing:   Observed
Gestational Age

 Clinical EDD:  40w 4d                                        EDD:   06/15/13
 Best:          40w 4d     Det. By:  Clinical EDD             EDD:   06/15/13
Impression

 Single IUP at 70w7d
 Post dates
 BPP [DATE] (-2 for movement, -2 for tone)
 Normal amniotic fluid volume
Recommendations

 Given current gestational age, recommend delivery.
 Route of delivery to be determined by fetal tracing - if
 reassuring, may have vaginal delivery; if not, would move
 toward cesarean delivery

## 2015-02-12 LAB — OB RESULTS CONSOLE HIV ANTIBODY (ROUTINE TESTING): HIV: NONREACTIVE

## 2015-02-12 LAB — OB RESULTS CONSOLE HEPATITIS B SURFACE ANTIGEN: Hepatitis B Surface Ag: NEGATIVE

## 2015-02-12 LAB — OB RESULTS CONSOLE GC/CHLAMYDIA
Chlamydia: NEGATIVE
GC PROBE AMP, GENITAL: NEGATIVE

## 2015-02-12 LAB — OB RESULTS CONSOLE RPR: RPR: NONREACTIVE

## 2015-02-12 LAB — OB RESULTS CONSOLE RUBELLA ANTIBODY, IGM: Rubella: IMMUNE

## 2015-02-12 LAB — OB RESULTS CONSOLE ANTIBODY SCREEN: Antibody Screen: NEGATIVE

## 2015-02-12 LAB — OB RESULTS CONSOLE ABO/RH: RH Type: POSITIVE

## 2015-03-13 ENCOUNTER — Other Ambulatory Visit (HOSPITAL_COMMUNITY): Payer: Self-pay | Admitting: Urology

## 2015-03-13 DIAGNOSIS — O280 Abnormal hematological finding on antenatal screening of mother: Secondary | ICD-10-CM

## 2015-03-18 ENCOUNTER — Other Ambulatory Visit (HOSPITAL_COMMUNITY): Payer: Self-pay | Admitting: Urology

## 2015-03-18 ENCOUNTER — Ambulatory Visit (HOSPITAL_COMMUNITY)
Admission: RE | Admit: 2015-03-18 | Discharge: 2015-03-18 | Disposition: A | Discharge status: Home / Self Care | Payer: Medicaid Other | Source: Ambulatory Visit | Attending: Physician Assistant | Admitting: Physician Assistant

## 2015-03-18 ENCOUNTER — Encounter (HOSPITAL_COMMUNITY): Payer: Self-pay

## 2015-03-18 DIAGNOSIS — Z315 Encounter for genetic counseling: Secondary | ICD-10-CM | POA: Insufficient documentation

## 2015-03-18 DIAGNOSIS — O283 Abnormal ultrasonic finding on antenatal screening of mother: Secondary | ICD-10-CM | POA: Diagnosis not present

## 2015-03-18 DIAGNOSIS — Z3689 Encounter for other specified antenatal screening: Secondary | ICD-10-CM

## 2015-03-18 DIAGNOSIS — O289 Unspecified abnormal findings on antenatal screening of mother: Secondary | ICD-10-CM

## 2015-03-18 DIAGNOSIS — Z3A20 20 weeks gestation of pregnancy: Secondary | ICD-10-CM

## 2015-03-18 DIAGNOSIS — O280 Abnormal hematological finding on antenatal screening of mother: Secondary | ICD-10-CM

## 2015-03-18 NOTE — Progress Notes (Addendum)
  Genetic Counseling  DOB: 1990/09/05 Referring Provider: Denna Haggardaster, [place], NP Appointment Date: 03/18/2015 Attending: Dr. Particia NearingMartha Decker  Ms. Loletta SpecterMaria Ramirez-Hernandez was seen for genetic counseling because of an increased risk for fetal Down syndrome based on an abnormal Quad screen.  In summary  Reviewed the results of the Quad screen  Discussed additional options for further testing and screening  Ultrasound performed today  Declined amniocentesis  Declined NIPS  If screening for hemoglobin disorders has not been performed, in this or a previous pregnancy, you may wish to consider it, given her ethnicity.  She was counseled regarding the Quad screen result and the associated 1 in 35 risk for fetal Down syndrome.  We reviewed chromosomes, nondisjunction, and the common features and variable prognosis of Down syndrome.  In addition, we reviewed the screen adjusted reduction in risks for trisomy 18 and ONTDs.  We also discussed other explanations for a screen positive result including: a gestational dating error, differences in maternal metabolism, and normal variation. She understands that this screening is not diagnostic for Down syndrome but provides a risk assessment.  We reviewed available screening options including noninvasive prenatal screening (NIPS)/cell free DNA (cfDNA) screening, and detailed ultrasound.  She was counseled that screening tests are used to modify a patient's a priori risk for aneuploidy, typically based on age. This estimate provides a pregnancy specific risk assessment. We reviewed the benefits and limitations of each option. Specifically, we discussed the conditions for which each test screens, the detection rates, and false positive rates of each. She was also counseled regarding diagnostic testing via amniocentesis. We reviewed the approximate 1 in 300-500 risk for complications from amniocentesis, including spontaneous pregnancy loss.   A complete ultrasound was  performed today. The ultrasound report will be sent under separate cover. There were no visualized fetal anomalies or markers suggestive of aneuploidy. Diagnostic testing and NIPS were declined today.  She understands that screening tests cannot rule out all birth defects or genetic syndromes. The patient was advised of this limitation and states she still does not want diagnostic testing or additional screening at this time.    Ms. Cottie BandaRamirez-Hernandez was provided with written information regarding cystic fibrosis (CF) including the carrier frequency and incidence in various populations, the availability of carrier testing and prenatal diagnosis if indicated.  In addition, we discussed that CF is routinely screened for as part of the [facility] newborn screening panel.  She was counseled that this testing was previously performed and a mutation was not found.  However, carrier screening results for hemoglobinopathies were not available for review.  Given that disorders of hemoglobin are more common in persons of [place] descent, if screening for these conditions has not been performed, you may wish to consider it.  Both family histories were reviewed and found to be noncontributory for birth defects, mental retardation and genetic conditions. Without further information regarding the provided family history, an accurate genetic risk cannot be calculated. Further genetic counseling is warranted if more information is obtained.  Ms. Cottie BandaRamirez-Hernandez denied exposure to environmental toxins or chemical agents. She denied the use of alcohol, tobacco or street drugs. She denied significant viral illnesses during the course of her pregnancy. Her medical and surgical histories were noncontributory.   I counseled Ms. Ramirez-Hernandez for approximately 45 minutes regarding the above risks and available options.   Mady Gemmaaragh Victory [place], MS,  Certified Genetic Counselor

## 2015-03-23 ENCOUNTER — Encounter (HOSPITAL_COMMUNITY): Payer: Self-pay

## 2015-03-23 ENCOUNTER — Other Ambulatory Visit (HOSPITAL_COMMUNITY): Payer: Self-pay

## 2015-04-05 NOTE — L&D Delivery Note (Signed)
Delivery Note Labor progressed normally. At 2:33 PM a viable female was delivered via Vaginal, Spontaneous Delivery (Presentation: vertex; Occiput Anterior).  APGAR: 9, 9; weight pending.   Placenta status: Intact, Spontaneous.  Cord: 3 vessels with the following complications: None.  Cord pH: not obtained  Anesthesia: Other  Episiotomy: None Lacerations:  None Suture Repair: n/a Est. Blood Loss (mL):  150ml  Mom to postpartum.  Baby to Couplet care / Skin to Skin.  Jinny BlossomKaty D Mayo 08/06/2015, 2:46 PM  OB fellow attestation: Patient is a G2P1001 at 9436w2d who was admitted for active labor, uncomplicated prenatal course.    I was gloved and present for delivery in its entirety.  Second stage of labor progressed.  decels during second stage noted.  Complications: none  Lacerations: none  EBL: 150mL  Federico FlakeKimberly Niles Newton, MD 3:14 PM

## 2015-07-07 LAB — OB RESULTS CONSOLE GC/CHLAMYDIA
CHLAMYDIA, DNA PROBE: NEGATIVE
GC PROBE AMP, GENITAL: NEGATIVE

## 2015-07-07 LAB — OB RESULTS CONSOLE GBS: GBS: NEGATIVE

## 2015-08-05 ENCOUNTER — Encounter (HOSPITAL_COMMUNITY): Payer: Self-pay | Admitting: *Deleted

## 2015-08-05 ENCOUNTER — Telehealth (HOSPITAL_COMMUNITY): Payer: Self-pay | Admitting: *Deleted

## 2015-08-05 NOTE — Telephone Encounter (Signed)
Preadmission screen  

## 2015-08-06 ENCOUNTER — Inpatient Hospital Stay (HOSPITAL_COMMUNITY): Payer: Medicaid Other | Admitting: Anesthesiology

## 2015-08-06 ENCOUNTER — Encounter (HOSPITAL_COMMUNITY): Payer: Self-pay | Admitting: *Deleted

## 2015-08-06 ENCOUNTER — Inpatient Hospital Stay (HOSPITAL_COMMUNITY)
Admission: AD | Admit: 2015-08-06 | Discharge: 2015-08-07 | DRG: 775 | Disposition: A | Discharge status: Home / Self Care | Payer: Medicaid Other | Source: Ambulatory Visit | Attending: Obstetrics & Gynecology | Admitting: Obstetrics & Gynecology

## 2015-08-06 DIAGNOSIS — Z3A40 40 weeks gestation of pregnancy: Secondary | ICD-10-CM

## 2015-08-06 DIAGNOSIS — IMO0001 Reserved for inherently not codable concepts without codable children: Secondary | ICD-10-CM

## 2015-08-06 DIAGNOSIS — Z23 Encounter for immunization: Secondary | ICD-10-CM

## 2015-08-06 LAB — CBC
HEMATOCRIT: 39.3 % (ref 36.0–46.0)
HEMOGLOBIN: 14.3 g/dL (ref 12.0–15.0)
MCH: 29.8 pg (ref 26.0–34.0)
MCHC: 36.4 g/dL — AB (ref 30.0–36.0)
MCV: 81.9 fL (ref 78.0–100.0)
Platelets: 139 10*3/uL — ABNORMAL LOW (ref 150–400)
RBC: 4.8 MIL/uL (ref 3.87–5.11)
RDW: 13.6 % (ref 11.5–15.5)
WBC: 7.3 10*3/uL (ref 4.0–10.5)

## 2015-08-06 LAB — TYPE AND SCREEN
ABO/RH(D): A POS
ANTIBODY SCREEN: NEGATIVE

## 2015-08-06 MED ORDER — OXYTOCIN BOLUS FROM INFUSION
500.0000 mL | INTRAVENOUS | Status: DC
Start: 2015-08-06 — End: 2015-08-06

## 2015-08-06 MED ORDER — OXYTOCIN 10 UNIT/ML IJ SOLN
2.5000 [IU]/h | INTRAVENOUS | Status: DC
  Administered 2015-08-06: 39.96 [IU]/h via INTRAVENOUS
  Filled 2015-08-06: qty 4

## 2015-08-06 MED ORDER — LACTATED RINGERS IV SOLN
500.0000 mL | Freq: Once | INTRAVENOUS | Status: DC
Start: 2015-08-06 — End: 2015-08-06

## 2015-08-06 MED ORDER — ACETAMINOPHEN 325 MG PO TABS: 650.0000 mg | ORAL_TABLET | ORAL | Status: DC | PRN

## 2015-08-06 MED ORDER — LACTATED RINGERS IV SOLN: 500.0000 mL | INTRAVENOUS | Status: DC | PRN

## 2015-08-06 MED ORDER — SENNOSIDES-DOCUSATE SODIUM 8.6-50 MG PO TABS
2.0000 | ORAL_TABLET | ORAL | Status: DC
Start: 2015-08-07 — End: 2015-08-07
  Administered 2015-08-07: 2 via ORAL
  Filled 2015-08-06: qty 2

## 2015-08-06 MED ORDER — DIBUCAINE 1 % RE OINT
1.0000 | TOPICAL_OINTMENT | RECTAL | Status: DC | PRN
Start: 2015-08-06 — End: 2015-08-07

## 2015-08-06 MED ORDER — PHENYLEPHRINE 40 MCG/ML (10ML) SYRINGE FOR IV PUSH (FOR BLOOD PRESSURE SUPPORT)
80.0000 ug | PREFILLED_SYRINGE | INTRAVENOUS | Status: DC | PRN
  Filled 2015-08-06: qty 10

## 2015-08-06 MED ORDER — FLEET ENEMA 7-19 GM/118ML RE ENEM: 1.0000 | ENEMA | RECTAL | Status: DC | PRN

## 2015-08-06 MED ORDER — FENTANYL 2.5 MCG/ML BUPIVACAINE 1/10 % EPIDURAL INFUSION (WH - ANES)
14.0000 mL/h | INTRAMUSCULAR | Status: DC | PRN
  Filled 2015-08-06: qty 125

## 2015-08-06 MED ORDER — PRENATAL MULTIVITAMIN CH
1.0000 | ORAL_TABLET | Freq: Every day | ORAL | Status: DC
Start: 2015-08-07 — End: 2015-08-07
  Administered 2015-08-07: 1 via ORAL
  Filled 2015-08-06: qty 1

## 2015-08-06 MED ORDER — LACTATED RINGERS IV SOLN
INTRAVENOUS | Status: DC
  Administered 2015-08-06 (×2): via INTRAVENOUS

## 2015-08-06 MED ORDER — OXYCODONE-ACETAMINOPHEN 5-325 MG PO TABS: 1.0000 | ORAL_TABLET | ORAL | Status: DC | PRN

## 2015-08-06 MED ORDER — LIDOCAINE HCL (PF) 1 % IJ SOLN
INTRAMUSCULAR | Status: DC | PRN
  Administered 2015-08-06 (×2): 4 mL

## 2015-08-06 MED ORDER — DIPHENHYDRAMINE HCL 25 MG PO CAPS: 25.0000 mg | ORAL_CAPSULE | Freq: Four times a day (QID) | ORAL | Status: DC | PRN

## 2015-08-06 MED ORDER — COCONUT OIL OIL
1.0000 "application " | TOPICAL_OIL | Status: DC | PRN
  Filled 2015-08-06: qty 120

## 2015-08-06 MED ORDER — LIDOCAINE HCL (PF) 1 % IJ SOLN
30.0000 mL | INTRAMUSCULAR | Status: DC | PRN
  Filled 2015-08-06: qty 30

## 2015-08-06 MED ORDER — EPHEDRINE 5 MG/ML INJ: 10.0000 mg | INTRAVENOUS | Status: DC | PRN

## 2015-08-06 MED ORDER — IBUPROFEN 600 MG PO TABS
600.0000 mg | ORAL_TABLET | Freq: Four times a day (QID) | ORAL | Status: DC
  Administered 2015-08-06 – 2015-08-07 (×4): 600 mg via ORAL
  Filled 2015-08-06 (×4): qty 1

## 2015-08-06 MED ORDER — ONDANSETRON HCL 4 MG/2ML IJ SOLN: 4.0000 mg | Freq: Four times a day (QID) | INTRAMUSCULAR | Status: DC | PRN

## 2015-08-06 MED ORDER — TETANUS-DIPHTH-ACELL PERTUSSIS 5-2.5-18.5 LF-MCG/0.5 IM SUSP
0.5000 mL | Freq: Once | INTRAMUSCULAR | Status: DC
  Filled 2015-08-06: qty 0.5

## 2015-08-06 MED ORDER — CITRIC ACID-SODIUM CITRATE 334-500 MG/5ML PO SOLN: 30.0000 mL | ORAL | Status: DC | PRN

## 2015-08-06 MED ORDER — ONDANSETRON HCL 4 MG/2ML IJ SOLN: 4.0000 mg | INTRAMUSCULAR | Status: DC | PRN

## 2015-08-06 MED ORDER — DIPHENHYDRAMINE HCL 50 MG/ML IJ SOLN: 12.5000 mg | INTRAMUSCULAR | Status: DC | PRN

## 2015-08-06 MED ORDER — WITCH HAZEL-GLYCERIN EX PADS: 1.0000 "application " | MEDICATED_PAD | CUTANEOUS | Status: DC | PRN

## 2015-08-06 MED ORDER — ONDANSETRON HCL 4 MG PO TABS: 4.0000 mg | ORAL_TABLET | ORAL | Status: DC | PRN

## 2015-08-06 MED ORDER — OXYCODONE-ACETAMINOPHEN 5-325 MG PO TABS
2.0000 | ORAL_TABLET | ORAL | Status: DC | PRN
Start: 2015-08-06 — End: 2015-08-06

## 2015-08-06 MED ORDER — ZOLPIDEM TARTRATE 5 MG PO TABS: 5.0000 mg | ORAL_TABLET | Freq: Every evening | ORAL | Status: DC | PRN

## 2015-08-06 MED ORDER — BENZOCAINE-MENTHOL 20-0.5 % EX AERO
1.0000 | INHALATION_SPRAY | CUTANEOUS | Status: DC | PRN
Start: 2015-08-06 — End: 2015-08-07
  Filled 2015-08-06: qty 56

## 2015-08-06 MED ORDER — SIMETHICONE 80 MG PO CHEW: 80.0000 mg | CHEWABLE_TABLET | ORAL | Status: DC | PRN

## 2015-08-06 MED ORDER — PHENYLEPHRINE 40 MCG/ML (10ML) SYRINGE FOR IV PUSH (FOR BLOOD PRESSURE SUPPORT): 80.0000 ug | PREFILLED_SYRINGE | INTRAVENOUS | Status: DC | PRN

## 2015-08-06 MED ORDER — FENTANYL 2.5 MCG/ML BUPIVACAINE 1/10 % EPIDURAL INFUSION (WH - ANES)
INTRAMUSCULAR | Status: DC | PRN
  Administered 2015-08-06: 14 mL/h via EPIDURAL

## 2015-08-06 NOTE — Lactation Note (Signed)
This note was copied from a baby's chart. Lactation Consultation Note  Patient Name: Dorothy [name] [id number]'s Date: 08/06/2015 Reason for consult: Initial assessment Baby at 7 hr of life and mom reports bf is going well. It takes baby 2-3 attempts then he latches well per mom. She denies breast or nipple pain, voiced no concerns. She has trouble with bf in the first few days with her older child and is worried she will cracked bleeding nipples again. Discussed ways to get and maintain a deep latch. Discussed baby behavior, feeding frequency, baby belly size, voids, wt loss, breast changes, and nipple care. Demonstrated manual expression, colostrum noted bilaterally, spoon in room. Given lactation handouts. Aware of OP services and support group. Mom will offer the breast on demand 8+/24 hr and f/u each feeding with expressed milk on the nipples.      Maternal Data Has patient been taught Hand Expression?: Yes Does the patient have breastfeeding experience prior to this delivery?: Yes  Feeding Feeding Type: Breast Fed Length of feed: 1 min  LATCH Score/Interventions                      Lactation Tools Discussed/Used WIC Program: Yes   Consult Status Consult Status: Follow-up Date: 08/07/15 Follow-up type: In-patient    [name] 08/06/2015, 9:39 PM

## 2015-08-06 NOTE — H&P (Signed)
OBSTETRIC ADMISSION HISTORY AND PHYSICAL  Dorothy [name] is a 25 y.o. female G2P1001 with IUP at 4038w2d by LMP/18 presenting for contractions/ROM. She reports +FMs, No LOF, no VB, no blurry vision, headaches or peripheral edema, and RUQ pain.  She plans on breast feeding. She is undecided on birth control.  Dating: By L/18 --->  Estimated Date of Delivery: 08/04/15   Prenatal History/Complications:   -1:35 Down Syndrome risk on quad screen. Seen by genetic counseling. Declined diagnostic testing and NIPS. Anatomy US was normal with no markers of aneuploidy.   Past Medical History: Past Medical History  Diagnosis Date  . Medical history non-contributory     Past Surgical History: Past Surgical History  Procedure Laterality Date  . No past surgeries      Obstetrical History: OB History    Gravida Para Term Preterm AB TAB SAB Ectopic Multiple Living   2 1 1       1       Social History: Social History   Social History  . Marital Status: Single    Spouse Name: N/A  . Number of Children: N/A  . Years of Education: N/A   Social History Main Topics  . Smoking status: Never Smoker   . Smokeless tobacco: Never Used  . Alcohol Use: No  . Drug Use: No  . Sexual Activity: Yes   Other Topics Concern  . None   Social History Narrative    Family History: History reviewed. No pertinent family history.  Allergies: No Known Allergies  Prescriptions prior to admission  Medication Sig Dispense Refill Last Dose  . Prenatal Vit-Fe Fumarate-FA (PRENATAL MULTIVITAMIN) TABS tablet Take 1 tablet by mouth daily at 12 noon.   08/05/2015 at Unknown time  . [DISCONTINUED] ibuprofen (ADVIL,MOTRIN) 600 MG tablet Take 1 tablet (600 mg total) by mouth every 6 (six) hours. 30 tablet 0      Review of Systems   All systems reviewed and negative except as stated in HPI  Blood pressure 120/85, pulse 88, temperature 98 F (36.7 C), temperature source Oral, resp. rate 16, height 5\' 1"   (1.549 m), weight 167 lb (75.751 kg), last menstrual period 10/28/2014, SpO2 99 %, unknown if currently breastfeeding. General appearance: alert, cooperative and appears stated age Lungs: clear to auscultation bilaterally Heart: regular rate and rhythm Abdomen: soft, non-tender; bowel sounds normal Pelvic: adequate Extremities: Homans sign is negative, no sign of DVT  Presentation: cephalic Fetal monitoringBaseline: 135 bpm, Variability: Good {> 6 bpm), Accelerations: Reactive and Decelerations: Absent Uterine activity: Mild/moderate occurring every 2-3 minutes  Dilation: 5 Effacement (%): 60 Station: 0 Exam by:: Ferne CoeS Earl RNC   Prenatal labs: ABO, Rh: --/--/A POS (05/04 1020) Antibody: PENDING (05/04 1020) Rubella: Immune RPR: Nonreactive (11/10 0000)  HBsAg: Negative (11/10 0000)  HIV: Non-reactive (11/10 0000)  GBS: Negative (04/04 0000)  1 hr Glucola 137, 3 hr wnl Genetic screening:  Quad screen with increased DSR 1:35 (declined NIP/Amnio) Anatomy US wnl  Prenatal Transfer Tool  Maternal Diabetes: No Genetic Screening: Declined Maternal Ultrasounds/Referrals: Normal Fetal Ultrasounds or other Referrals:  None Maternal Substance Abuse:  No Significant Maternal Medications:  None Significant Maternal Lab Results: Lab values include: Group B Strep negative  Results for orders placed or performed during the hospital encounter of 08/06/15 (from the past 24 hour(s))  CBC   Collection Time: 08/06/15 10:20 AM  Result Value Ref Range   WBC 7.3 4.0 - 10.5 K/uL   RBC 4.80 3.87 - 5.11 MIL/uL  Hemoglobin 14.3 12.0 - 15.0 g/dL   HCT 54.0 98.1 - 19.1 %   MCV 81.9 78.0 - 100.0 fL   MCH 29.8 26.0 - 34.0 pg   MCHC 36.4 (H) 30.0 - 36.0 g/dL   RDW 47.8 29.5 - 62.1 %   Platelets 139 (L) 150 - 400 K/uL  Type and screen [facility]   Collection Time: 08/06/15 10:20 AM  Result Value Ref Range   ABO/RH(D) A POS    Antibody Screen PENDING    Sample Expiration  08/09/2015     Patient Active Problem List   Diagnosis Date Noted  . Active labor 08/06/2015  . Post term pregnancy over 40 weeks 06/19/2013    Assessment: Dorothy [name] is a 25 y.o. G2P1001 at [redacted]w[redacted]d here for active labor.  #Labor:expectant management. Transfer to LD immediately #Pain: Prn meds, using nitrous. May consider epidural #FWB:  Cat I #ID:  GBS neg #MOF: Breast #MOC: Undecided #Circ: None  Hilton Sinclair, MD  08/06/2015, 11:27 AM    OB fellow attestation: I have seen and examined this patient; I agree with above documentation in the resident's note.   Dorothy [name] is a 25 y.o. [phone] here for Active labor  PE: BP 110/62 mmHg  Pulse 96  Temp(Src) 98.8 F (37.1 C) (Oral)  Resp 16  Ht  (1.549 m)  Wt 167 lb (75.751 kg)  BMI 31.57 kg/m2  SpO2 100%  LMP 10/28/2014  Breastfeeding? Unknown Gen: calm comfortable, NAD Resp: normal effort, no distress Abd: gravid  ROS, labs, PMH reviewed  Plan: Admit to LD Labor: Expectant managment FWB: Cat I ID: GBS neg  Federico Flake, MD  Family Medicine, OB Fellow 08/06/2015, 3:46 PM

## 2015-08-06 NOTE — Progress Notes (Signed)
Removed epidural with tip intact.

## 2015-08-06 NOTE — H&P (Signed)
LABOR AND DELIVERY ADMISSION HISTORY AND PHYSICAL NOTE  Dorothy [name] is a 25 y.o. female G2P1001 with IUP at [redacted]w[redacted]d by L/18  presenting for contractions and SROM at 700 this morning.  She reports +FM.  She denies LOF or VB prior to this morning, blurry vision, headaches, peripheral edema, and RUQ pain.  She is undecided on birth control.  Patient reports an uncomplicated pregnancy without any diagnoses of pregnancy or medications prescribed.  She was followed throughout her pregnancy at [facility].   Prenatal History/Complications:  Past Medical History: Past Medical History  Diagnosis Date  . Medical history non-contributory     Past Surgical History: Past Surgical History  Procedure Laterality Date  . No past surgeries      Obstetrical History: OB History    Gravida Para Term Preterm AB TAB SAB Ectopic Multiple Living   Social History: Social History   Social History  . Marital Status: Single    Spouse Name: N/A  . Number of Children: N/A  . Years of Education: N/A   Social History Main Topics  . Smoking status: Never Smoker   . Smokeless tobacco: Never Used  . Alcohol Use: No  . Drug Use: No  . Sexual Activity: Yes   Other Topics Concern  . None   Social History Narrative    Family History: History reviewed. No pertinent family history.  Allergies: No Known Allergies  Prescriptions prior to admission  Medication Sig Dispense Refill Last Dose  . Prenatal Vit-Fe Fumarate-FA (PRENATAL MULTIVITAMIN) TABS tablet Take 1 tablet by mouth daily at 12 noon.   08/05/2015 at Unknown time  . [DISCONTINUED] ibuprofen (ADVIL,MOTRIN) 600 MG tablet Take 1 tablet (600 mg total) by mouth every 6 (six) hours. 30 tablet 0      Review of Systems   All systems reviewed and negative except as stated in HPI  Blood pressure 123/72, pulse 83, temperature 97.8 F (36.6 C), temperature source Oral, resp. rate 18, height  (1.549 m), weight 75.751 kg  (167 lb), last menstrual period 10/28/2014, unknown if currently breastfeeding. General appearance: alert, cooperative, well-appearing Lungs: clear to auscultation bilaterally Heart: regular rate and rhythm Abdomen: soft, non-tender; bowel sounds normal Extremities: No calf swelling or tenderness Presentation: cephalic Fetal monitoring: Cat I (135/mod/+a/-d) Uterine activity: mild contractions every 2-3 min Dilation: 5 Effacement (%): 60 Station: 0 Exam by:: Ferne Coe RNC   Prenatal labs: ABO, Rh: A/Positive/-- (11/10 0000) Antibody: Negative (11/10 0000) Rubella: Imm (02/12/2015) RPR: Nonreactive (11/10 0000)  HBsAg: Negative (11/10 0000)  HIV: Non-reactive (11/10 0000)  GBS: Negative (04/04 0000)  1 hr Glucola: 137, 3 hr wnl Genetic screening:  Quad screen with increased DSR 1:35 (declined NIP/Amnio) Anatomy US: wnl  Prenatal Transfer Tool  Maternal Diabetes: No Genetic Screening: Declined Maternal Ultrasounds/Referrals: Normal Fetal Ultrasounds or other Referrals:  None Maternal Substance Abuse:  No Significant Maternal Medications:  None Significant Maternal Lab Results: Lab values include: Group B Strep negative  Results for orders placed or performed during the hospital encounter of 08/06/15 (from the past 24 hour(s))  CBC   Collection Time: 08/06/15 10:20 AM  Result Value Ref Range   WBC 7.3 4.0 - 10.5 K/uL   RBC 4.80 3.87 - 5.11 MIL/uL   Hemoglobin 14.3 12.0 - 15.0 g/dL   HCT 46.9 62.9 - 52.8 %   MCV 81.9 78.0 - 100.0 fL   MCH 29.8 26.0 -  34.0 pg   MCHC 36.4 (H) 30.0 - 36.0 g/dL   RDW 21.313.6 08.611.5 - 57.815.5 %   Platelets 139 (L) 150 - 400 K/uL    Patient Active Problem List   Diagnosis Date Noted  . Active labor 08/06/2015  . Post term pregnancy over 40 weeks 06/19/2013    Assessment: Dorothy [name] is a 25 y.o. G2P1001 at 724w2d here for active labor.  #Labor: 5/60/0  #Pain: 5/10; plans to use nitrous and considering  epidural #FWB: 135/mod/+a/-d #ID:  GBS neg #MOF: breast #MOC: Undecided #Circ:  None  Jonni SangerSara Kourtnee Lahey 08/06/2015, 10:46 AM

## 2015-08-06 NOTE — Anesthesia Preprocedure Evaluation (Signed)

## 2015-08-06 NOTE — MAU Note (Signed)
Pt reports she thinks her water broke around 7am  Clear with some blood streaks. . C/o irregular ctx. SVE yesterday 1-2 cm

## 2015-08-06 NOTE — Anesthesia Procedure Notes (Signed)
Epidural Patient location during procedure: OB  Staffing Anesthesiologist: Jlon Betker Performed by: anesthesiologist   Preanesthetic Checklist Completed: patient identified, site marked, surgical consent, pre-op evaluation, timeout performed, IV checked, risks and benefits discussed and monitors and equipment checked  Epidural Patient position: sitting Prep: site prepped and draped and DuraPrep Patient monitoring: continuous pulse ox and blood pressure Approach: midline Location: L3-L4 Injection technique: LOR saline  Needle:  Needle type: Tuohy  Needle gauge: 17 G Needle length: 9 cm and 9 Needle insertion depth: 5 cm cm Catheter type: closed end flexible Catheter size: 19 Gauge Catheter at skin depth: 10 cm Test dose: negative  Assessment Events: blood not aspirated, injection not painful, no injection resistance, negative IV test and no paresthesia  Additional Notes Patient identified. Risks/Benefits/Options discussed with patient including but not limited to bleeding, infection, nerve damage, paralysis, failed block, incomplete pain control, headache, blood pressure changes, nausea, vomiting, reactions to medication both or allergic, itching and postpartum back pain. Confirmed with bedside nurse the patient's most recent platelet count. Confirmed with patient that they are not currently taking any anticoagulation, have any bleeding history or any family history of bleeding disorders. Patient expressed understanding and wished to proceed. All questions were answered. Sterile technique was used throughout the entire procedure. Please see nursing notes for vital signs. Test dose was given through epidural catheter and negative prior to continuing to dose epidural or start infusion. Warning signs of high block given to the patient including shortness of breath, tingling/numbness in hands, complete motor block, or any concerning symptoms with instructions to call for help. Patient was  given instructions on fall risk and not to get out of bed. All questions and concerns addressed with instructions to call with any issues or inadequate analgesia.      

## 2015-08-07 LAB — RPR: RPR Ser Ql: NONREACTIVE

## 2015-08-07 MED ORDER — IBUPROFEN 600 MG PO TABS: 600.0000 mg | ORAL_TABLET | Freq: Four times a day (QID) | ORAL | Status: DC | PRN

## 2015-08-07 NOTE — Discharge Summary (Signed)
OB Discharge Summary     Patient Name: Dorothy [name] DOB: 13-Mar-1991 MRN: [id number]  Date of admission: 08/06/2015 Delivering MD: Lyndel Safe NILES   Date of discharge: 08/07/2015  Admitting diagnosis: 40WKS,WATER BROKE Intrauterine pregnancy: [redacted]w[redacted]d     Secondary diagnosis:  Active Problems:   Active labor  Additional problems: none     Discharge diagnosis: Term Pregnancy Delivered                                                                                                Post partum procedures:na  Augmentation: na  Complications: None  Hospital course:  Onset of Labor With Vaginal Delivery     25 y.o. yo [id number] at [redacted]w[redacted]d was admitted in Active Labor on 08/06/2015. Patient had an uncomplicated labor course as follows:  Membrane Rupture Time/Date: 7:00 AM ,08/06/2015   Intrapartum Procedures: Episiotomy: None [1]                                         Lacerations:  None [1]  Patient had a delivery of a Viable infant. 08/06/2015  Information for the patient's newborn:  Renny, Gunnarson [[id number]]  Delivery Method: Vaginal, Spontaneous Delivery (Filed from Delivery Summary)    Pateint had an uncomplicated postpartum course.  She is ambulating, tolerating a regular diet, passing flatus, and urinating well. Patient is discharged home in stable condition on 08/07/2015.    Physical exam  Filed Vitals:   08/06/15 1616 08/06/15 1740 08/06/15 2103 08/07/15 0421  BP: 92/57 117/63 110/70 100/57  Pulse: 71 80 78 79  Temp: 98.4 F (36.9 C) 98.4 F (36.9 C) 98.8 F (37.1 C) 98.3 F (36.8 C)  TempSrc: Oral Oral Oral   Resp: Height:      Weight:      SpO2:  99%  98%   General: alert, cooperative and no distress Lochia: appropriate Uterine Fundus: firm Incision: N/A DVT Evaluation: No evidence of DVT seen on physical exam. Labs: Lab Results  Component Value Date   WBC 7.3 08/06/2015   HGB 14.3 08/06/2015   HCT 39.3 08/06/2015   MCV 81.9 08/06/2015   PLT 139* 08/06/2015   No flowsheet data found.  Discharge instruction: per After Visit Summary and "Baby and Me Booklet".  After visit meds:    Medication List    ASK your doctor about these medications        prenatal multivitamin Tabs tablet  Take 1 tablet by mouth daily at 12 noon.        Diet: routine diet  Activity: Advance as tolerated. Pelvic rest for 6 weeks.   Outpatient follow up:6 weeks Follow up Appt:Future Appointments Date Time Provider Department Center  08/11/2015 7:30 AM [facility] ROOM [facility] None   Follow up Visit:No Follow-up on file.  Postpartum contraception: undecided. Discussed options  Newborn Data: Live born female  Birth Weight: 7 lb 2.1 oz (3235 g) APGAR: 7, 9  Baby Feeding: Breast Disposition:home with mother  08/07/2015 CRESENZO-DISHMAN,Salima Rumer, CNM

## 2015-08-07 NOTE — Anesthesia Postprocedure Evaluation (Signed)
Anesthesia Post Note  Patient: Loletta SpecterMaria Ramirez-Hernandez  Procedure(s) Performed: * No procedures listed *  Patient location during evaluation: Mother Baby Anesthesia Type: Epidural Level of consciousness: awake and alert Pain management: satisfactory to patient Vital Signs Assessment: post-procedure vital signs reviewed and stable Respiratory status: respiratory function stable Cardiovascular status: stable Postop Assessment: no headache, no backache, epidural receding, patient able to bend at knees, no signs of nausea or vomiting and adequate PO intake Anesthetic complications: no Comments: Comfort level was assessed by AnesthesiaTeam and the patient was pleased with the care, interventions, and services provided by the Department of Anesthesia.     Last Vitals:  Filed Vitals:   08/06/15 2103 08/07/15 0421  BP: 110/70 100/57  Pulse: 78 79  Temp: 37.1 C 36.8 C  Resp: 18 16    Last Pain:  Filed Vitals:   08/07/15 0503  PainSc: 0-No pain   Pain Goal: Patients Stated Pain Goal: 10 (08/06/15 1040)               Nieves Barberi

## 2015-08-07 NOTE — Progress Notes (Signed)
Per chart review, MOB is requesting assistance applying for emergency Medicaid.  CSW is unable to meet this need. CSW has notified financial counselor of MOB's request.  Re-consult CSW if additional needs arise.

## 2015-08-07 NOTE — Progress Notes (Signed)
Post Partum Day 1 Subjective:  Dorothy [name] is a 25 y.o. [id number] 3050w2d s/p NSVD.  No acute events overnight.  Pt denies problems with ambulating, voiding or po intake.  She denies nausea or vomiting.  Pain is well controlled.  She has had flatus. She has not had bowel movement.  Lochia Small.  Plan for birth control is IUD.  Method of Feeding: breast.  Patient has no complaints and would like to go home later this afternoon after meeting with social work to discuss getting emergency Medicaid.  Objective: Blood pressure 100/57, pulse 79, temperature 98.3 F (36.8 C), temperature source Oral, resp. rate 16, height 5\' 1"  (1.549 m), weight 75.751 kg (167 lb), last menstrual period 10/28/2014, SpO2 98 %, unknown if currently breastfeeding.  Physical Exam:  General: alert, cooperative and no distress Lochia:normal flow Chest: CTAB Heart: RRR no m/r/g Abdomen: +BS, soft, nontender Uterine Fundus: firm, below level of umbilicus DVT Evaluation: No evidence of DVT seen on physical exam.  Patient is ambulating well. Extremities: No edema   Recent Labs  08/06/15 1020  HGB 14.3  HCT 39.3    Assessment/Plan:  ASSESSMENT: Dorothy [name] is a 25 y.o. [id number] 4350w2d s/p NSVD. Patient is doing really well and would like to go home this afternoon after meeting with social work.  Discharge home later today   LOS: 1 day   Jonni SangerSara Feldman 08/07/2015, 7:41 AM   I have seen and examined this patient and agree the above assessment.  Respiratory effort normal, lochia appropriate, legs negative,  pain level normal.Separate note written in epic  CRESENZO-DISHMAN,Brailen Macneal 08/12/2015 12:03 PM

## 2015-08-07 NOTE — Lactation Note (Signed)
This note was copied from a baby's chart. Lactation Consultation Note  Patient Name: Dorothy [name] ZOXWR'UToday's Date: 08/07/2015 Reason for consult: Follow-up assessment Mom reporting baby not sustaining the latch at breast. She has started to supplement. Baby recently BF and had supplement, currently asleep. Mom has history of LMS with 1st baby. Basic teaching reviewed, stressed importance of baby being to breast with each feeding to encourage milk production and protect milk supply. Advised baby should be at the breast 8-12 times or more in 24 hours, nursing for 15-30 minutes both breasts some feedings once over 24 hours of age. Encouraged Mom if she plans to continue to supplement to be sure she BF 1st before giving any bottles. LC left phone number for Mom to call with next feeding to assist with latch to increase BF success.   Maternal Data    Feeding Feeding Type: Formula Length of feed: 15 min  LATCH Score/Interventions                      Lactation Tools Discussed/Used     Consult Status Consult Status: Follow-up Date: 08/07/15 Follow-up type: In-patient    Dorothy LevinsGranger, Dorothy [name] Ann 08/07/2015, 12:24 PM

## 2015-08-07 NOTE — Anesthesia Post-op Follow-up Note (Cosign Needed)
  Anesthesia Pain Follow-up Note  Patient: Loletta SpecterMaria Ramirez-Hernandez  Day #: 1  Date of Follow-up: 08/07/2015 Time: 9:00 AM  Last Vitals:  Filed Vitals:   08/06/15 2103 08/07/15 0421  BP: 110/70 100/57  Pulse: 78 79  Temp: 37.1 C 36.8 C  Resp: 18 16    Level of Consciousness: alert  Pain: none   Side Effects:None  Catheter Site Exam:healing  Plan: D/C from anesthesia care  Elveria Lauderbaugh

## 2015-08-07 NOTE — Lactation Note (Signed)
This note was copied from a baby's chart. Lactation Consultation Note  Patient Name: Dorothy [name] ZOXWR'UToday's Date: 08/07/2015 Reason for consult: Follow-up assessment Assisted Mom with latching baby to obtain more depth. Mom reports some discomfort with initial latch that resolves with baby nursing. Demonstrated how to un-tuck lower lip for more depth. Demonstrated breast compression to help with latch. Mom wanted to try nipple shield to see if this help baby obtain more depth but baby did better without the nipple shield, tried #20.  No colostrum visible in the nipple shield with nursing few minutes. Mom has history of LMS with 1st son, no colostrum expressed with hand expression, however did hear few swallows with baby at breast.  Encouraged Mom to BF with each feeding, 8-12 times or more in 24 hours. Post pump at least 4 times per day for 15 minutes to maximize milk production and to have EBM to supplement. Mom reports she has DEBP at home.  Encouraged to continue to supplement per guidelines per hours of age till milk comes in well. Mom plans to use bottle. If baby fussy at breast discussed giving appetizer of 5-10 ml then latch baby to finish feeding, supplement as needed to satisfy baby. Information on Fenugreek given. Engorgement care reviewed if needed, refer to Baby N Me booklet, page 24. Breast milk storage guidelines - refer to page 25 Baby N Me booklet. Offered OP f/u, Mom will call if desires. Advised of support group. Hand out given for nipple shield if Mom decides to try again. Mom denies other questions/concerns.   Maternal Data    Feeding Feeding Type: Breast Fed Length of feed: 20 min  LATCH Score/Interventions Latch: Grasps breast easily, tongue down, lips flanged, rhythmical sucking. Intervention(s): Adjust position;Assist with latch;Breast massage;Breast compression  Audible Swallowing: A few with stimulation  Type of Nipple: Everted at rest and after  stimulation  Comfort (Breast/Nipple): Filling, red/small blisters or bruises, mild/mod discomfort  Problem noted: Mild/Moderate discomfort Interventions (Mild/moderate discomfort):  (EBM for nipple tenderness)  Hold (Positioning): Assistance needed to correctly position infant at breast and maintain latch. Intervention(s): Breastfeeding basics reviewed;Support Pillows;Position options;Skin to skin  LATCH Score: 7  Lactation Tools Discussed/Used Tools: Nipple Shields Nipple shield size: 20   Consult Status Consult Status: Complete Date: 08/07/15 Follow-up type: In-patient    Dorothy LevinsGranger, Dorothy [name] 08/07/2015, 4:01 PM

## 2015-08-07 NOTE — Discharge Instructions (Signed)

## 2015-08-11 ENCOUNTER — Inpatient Hospital Stay (HOSPITAL_COMMUNITY): Admission: RE | Admit: 2015-08-11 | Payer: Medicaid Other | Source: Ambulatory Visit

## 2018-03-29 ENCOUNTER — Ambulatory Visit (HOSPITAL_COMMUNITY)
Admission: EM | Admit: 2018-03-29 | Discharge: 2018-03-29 | Disposition: A | Discharge status: Home / Self Care | Payer: Self-pay | Attending: Family Medicine | Admitting: Family Medicine

## 2018-03-29 ENCOUNTER — Encounter (HOSPITAL_COMMUNITY): Payer: Self-pay

## 2018-03-29 DIAGNOSIS — N390 Urinary tract infection, site not specified: Secondary | ICD-10-CM | POA: Insufficient documentation

## 2018-03-29 LAB — POCT URINALYSIS DIP (DEVICE)
BILIRUBIN URINE: NEGATIVE
Glucose, UA: NEGATIVE mg/dL
KETONES UR: NEGATIVE mg/dL
NITRITE: NEGATIVE
Protein, ur: NEGATIVE mg/dL
Specific Gravity, Urine: 1.02 (ref 1.005–1.030)
Urobilinogen, UA: 0.2 mg/dL (ref 0.0–1.0)
pH: 7 (ref 5.0–8.0)

## 2018-03-29 MED ORDER — SULFAMETHOXAZOLE-TRIMETHOPRIM 800-160 MG PO TABS: 1.0000 | ORAL_TABLET | Freq: Two times a day (BID) | ORAL | 0 refills | Status: AC

## 2018-03-29 MED ORDER — FLUCONAZOLE 150 MG PO TABS: 150.0000 mg | ORAL_TABLET | Freq: Every day | ORAL | 0 refills | Status: DC

## 2018-03-29 NOTE — ED Provider Notes (Signed)
[facility]    CSN: [id number] Arrival date & time: 03/29/18  1004     History   Chief Complaint Chief Complaint  Patient presents with  . Urinary Frequency    HPI Taisia Fantini is a 27 y.o. female.   HPI  Patient is here for vaginal itching.  She had some scant discharge.  She thought she had a yeast infection.  After 1 day of itching she did start her..  She did notice she had a little dysuria.  Frequency.  She feels like she has some trouble emptying her bladder.  No pelvic pain.  No back pain.  No history of kidney stones or kidney infections.  No fever chills.  She has been a stable relationship for 7 years and has no concern about STD.   Past Medical History:  Diagnosis Date  . Medical history non-contributory     Patient Active Problem List   Diagnosis Date Noted  . Active labor 08/06/2015  . Post term pregnancy over 40 weeks 06/19/2013    Past Surgical History:  Procedure Laterality Date  . NO PAST SURGERIES      OB History    Gravida  2   Para  2   Term  2   Preterm      AB      Living  2     SAB      TAB      Ectopic      Multiple  0   Live Births  2            Home Medications    Prior to Admission medications   Medication Sig Start Date End Date Taking? Authorizing Provider  fluconazole (DIFLUCAN) 150 MG tablet Take 1 tablet (150 mg total) by mouth daily. Repeat in 1 week if needed 03/29/18   Eustace Moore, MD  ibuprofen (ADVIL,MOTRIN) 600 MG tablet Take 1 tablet (600 mg total) by mouth every 6 (six) hours as needed. 08/07/15   Arabella Merles, CNM  Prenatal Vit-Fe Fumarate-FA (PRENATAL MULTIVITAMIN) TABS tablet Take 1 tablet by mouth daily at 12 noon.    [provider]  sulfamethoxazole-trimethoprim (BACTRIM DS,SEPTRA DS) 800-160 MG tablet Take 1 tablet by mouth 2 (two) times daily for 5 days. 03/29/18 04/03/18  Eustace Moore, MD    Family History History reviewed. No pertinent family  history.  Social History Social History   Tobacco Use  . Smoking status: Never Smoker  . Smokeless tobacco: Never Used  Substance Use Topics  . Alcohol use: No  . Drug use: No     Allergies   Patient has no known allergies.   Review of Systems Review of Systems  Constitutional: Negative for chills and fever.  HENT: Negative for ear pain and sore throat.   Eyes: Negative for pain and visual disturbance.  Respiratory: Negative for cough and shortness of breath.   Cardiovascular: Negative for chest pain and palpitations.  Gastrointestinal: Negative for abdominal pain and vomiting.  Genitourinary: Positive for difficulty urinating, dysuria and vaginal discharge. Negative for hematuria.  Musculoskeletal: Negative for arthralgias and back pain.  Skin: Negative for color change and rash.  Neurological: Negative for seizures and syncope.  All other systems reviewed and are negative.    Physical Exam Triage Vital Signs ED Triage Vitals  Enc Vitals Group     BP 03/29/18 1134 102/66     Pulse Rate 03/29/18 1134 72     Resp 03/29/18  1134 16     Temp 03/29/18 1134 98.7 F (37.1 C)     Temp Source 03/29/18 1134 Oral     SpO2 03/29/18 1134 100 %     Weight --      Height --      Head Circumference --      Peak Flow --      Pain Score 03/29/18 1135 5     Pain Loc --      Pain Edu? --      Excl. in GC? --    No data found.  Updated Vital Signs BP 102/66 (BP Location: Left Arm)   Pulse 72   Temp 98.7 F (37.1 C) (Oral)   Resp 16   LMP 03/29/2018   SpO2 100%       Physical Exam Constitutional:      General: She is not in acute distress.    Appearance: She is well-developed.  HENT:     Head: Normocephalic and atraumatic.  Eyes:     Conjunctiva/sclera: Conjunctivae normal.     Pupils: Pupils are equal, round, and reactive to light.  Neck:     Musculoskeletal: Normal range of motion.  Cardiovascular:     Rate and Rhythm: Normal rate.  Pulmonary:     Effort:  Pulmonary effort is normal. No respiratory distress.  Abdominal:     General: There is no distension.     Palpations: Abdomen is soft.     Comments: No abdominal tenderness.  No CVAT  Musculoskeletal: Normal range of motion.  Skin:    General: Skin is warm and dry.  Neurological:     General: No focal deficit present.     Mental Status: She is alert. Mental status is at baseline.  Psychiatric:        Mood and Affect: Mood normal.      UC Treatments / Results  Labs (all labs ordered are listed, but only abnormal results are displayed) Labs Reviewed  POCT URINALYSIS DIP (DEVICE) - Abnormal; Notable for the following components:      Result Value   Hgb urine dipstick LARGE (*)    Leukocytes, UA SMALL (*)    All other components within normal limits  URINE CULTURE    EKG None  Radiology No results found.  Procedures Procedures (including critical care time)  Medications Ordered in UC Medications - No data to display  Initial Impression / Assessment and Plan / UC Course  I have reviewed the triage vital signs and the nursing notes.  Pertinent labs & imaging results that were available during my care of the patient were reviewed by me and considered in my medical decision making (see chart for details).    Will treat both for cystitis and yeast infection. Final Clinical Impressions(s) / UC Diagnoses   Final diagnoses:  Lower urinary tract infectious disease     Discharge Instructions     Your urine test indicated likely bladder infection.  I am treating this with 5 days of antibiotics.  Because antibiotics can cause yeast infections, I am also giving you  medicine for yeast.   I expect improvement in just a couple of days Drink plenty of water See Your PCP if symptoms persist We did lab testing during this visit.  If there are any abnormal findings that require change in medicine or indicate a positive result, you will be notified.  If all of your tests are  normal, you will not be called.  ED Prescriptions    Medication Sig Dispense Auth. Provider   sulfamethoxazole-trimethoprim (BACTRIM DS,SEPTRA DS) 800-160 MG tablet Take 1 tablet by mouth 2 (two) times daily for 5 days. 10 tablet Eustace MooreNelson, Goro Wenrick Sue, MD   fluconazole (DIFLUCAN) 150 MG tablet Take 1 tablet (150 mg total) by mouth daily. Repeat in 1 week if needed 2 tablet Eustace MooreNelson, Jack Mineau Sue, MD     Controlled Substance Prescriptions [place] Controlled Substance Registry consulted? Not Applicable   Eustace MooreNelson, Demon Volante Sue, MD 03/29/18 [phone]

## 2018-03-29 NOTE — ED Triage Notes (Signed)
Pt present vaginal itching and a burning sensation after urinating. Symptoms started 5 days ago.

## 2018-03-29 NOTE — Discharge Instructions (Addendum)
Your urine test indicated likely bladder infection.  I am treating this with 5 days of antibiotics.  Because antibiotics can cause yeast infections, I am also giving you  medicine for yeast.   I expect improvement in just a couple of days Drink plenty of water See Your PCP if symptoms persist We did lab testing during this visit.  If there are any abnormal findings that require change in medicine or indicate a positive result, you will be notified.  If all of your tests are normal, you will not be called.

## 2018-03-30 LAB — URINE CULTURE

## 2019-08-10 ENCOUNTER — Ambulatory Visit: Payer: Self-pay | Attending: Internal Medicine

## 2019-08-10 DIAGNOSIS — Z23 Encounter for immunization: Secondary | ICD-10-CM

## 2019-08-10 NOTE — Progress Notes (Signed)
   Covid-19 Vaccination Clinic  Name:  Dorothy [name]    MRN: [id number] DOB: [birth year]  08/10/2019  Dorothy [name] was observed post Covid-19 immunization for 15 minutes without incident. She was provided with Vaccine Information Sheet and instruction to access the V-Safe system.   Dorothy [name] was instructed to call 911 with any severe reactions post vaccine: Marland Kitchen Difficulty breathing  . Swelling of face and throat  . A fast heartbeat  . A bad rash all over body  . Dizziness and weakness   Immunizations Administered    Name Date Dose VIS Date Route   Pfizer COVID-19 Vaccine 08/10/2019  9:17 AM 0.3 mL 05/29/2018 Intramuscular   Manufacturer: [employer], [employer]   Lot: [id number]   NDC: [address]-9

## 2019-09-03 ENCOUNTER — Ambulatory Visit: Payer: Self-pay | Attending: Internal Medicine

## 2019-09-03 DIAGNOSIS — Z23 Encounter for immunization: Secondary | ICD-10-CM

## 2019-09-03 NOTE — Progress Notes (Signed)
   Covid-19 Vaccination Clinic  Name:  Nabilah Davoli    MRN: [id number] DOB: 29-Nov-1990  09/03/2019  Ms. Ramirez-Hernandez was observed post Covid-19 immunization for 15 minutes without incident. She was provided with Vaccine Information Sheet and instruction to access the V-Safe system.   Ms. Blough was instructed to call 911 with any severe reactions post vaccine: Marland Kitchen Difficulty breathing  . Swelling of face and throat  . A fast heartbeat  . A bad rash all over body  . Dizziness and weakness   Immunizations Administered    Name Date Dose VIS Date Route   Pfizer COVID-19 Vaccine 09/03/2019  4:55 PM 0.3 mL 05/29/2018 Intramuscular   Manufacturer: [employer], [employer]   Lot: [id number]   NDC: [address]-9

## 2020-07-06 ENCOUNTER — Other Ambulatory Visit: Payer: Self-pay

## 2020-07-06 ENCOUNTER — Encounter (HOSPITAL_COMMUNITY): Payer: Self-pay

## 2020-07-06 ENCOUNTER — Ambulatory Visit (HOSPITAL_COMMUNITY)
Admission: EM | Admit: 2020-07-06 | Discharge: 2020-07-06 | Disposition: A | Discharge status: Home / Self Care | Payer: Managed Care, Other (non HMO)

## 2020-07-06 DIAGNOSIS — L239 Allergic contact dermatitis, unspecified cause: Secondary | ICD-10-CM | POA: Diagnosis not present

## 2020-07-06 MED ORDER — PREDNISONE 10 MG (21) PO TBPK: ORAL_TABLET | Freq: Every day | ORAL | 0 refills | Status: DC

## 2020-07-06 NOTE — ED Provider Notes (Signed)
[facility]    CSN: [id number] Arrival date & time: 07/06/20  5573      History   Chief Complaint Chief Complaint  Patient presents with  . Rash    HPI Dorothy [name] is a 30 y.o. female presenting with itchy rash on arms and chest x3 days. Medical history noncontributory. benedryl gives some relief.  Denies outdoor exposure, new products, new fragrances.  Describes rash as itchy but not painful or burning.  Denies fever/chills, discharge.  HPI  Past Medical History:  Diagnosis Date  . Medical history non-contributory     Patient Active Problem List   Diagnosis Date Noted  . Active labor 08/06/2015  . Post term pregnancy over 40 weeks 06/19/2013    Past Surgical History:  Procedure Laterality Date  . NO PAST SURGERIES      OB History    Gravida  2   Para  2   Term  2   Preterm      AB      Living  2     SAB      IAB      Ectopic      Multiple  0   Live Births  2            Home Medications    Prior to Admission medications   Medication Sig Start Date End Date Taking? Authorizing Provider  diphenhydrAMINE (BENADRYL) 50 MG tablet Take 50 mg by mouth at bedtime as needed for itching.   Yes [provider]  Multiple Vitamin (MULTIVITAMIN) tablet Take 1 tablet by mouth daily.   Yes [provider]  predniSONE (STERAPRED UNI-PAK 21 TAB) 10 MG (21) TBPK tablet Take by mouth daily. Take 6 tabs by mouth daily  for 2 days, then 5 tabs for 2 days, then 4 tabs for 2 days, then 3 tabs for 2 days, 2 tabs for 2 days, then 1 tab by mouth daily for 2 days 07/06/20  Yes Cheree Ditto, Lyman Speller, PA-C  fluconazole (DIFLUCAN) 150 MG tablet Take 1 tablet (150 mg total) by mouth daily. Repeat in 1 week if needed 03/29/18   Eustace Moore, MD  ibuprofen (ADVIL,MOTRIN) 600 MG tablet Take 1 tablet (600 mg total) by mouth every 6 (six) hours as needed. 08/07/15   Arabella Merles, CNM  Prenatal Vit-Fe Fumarate-FA (PRENATAL MULTIVITAMIN)  TABS tablet Take 1 tablet by mouth daily at 12 noon.    [provider]    Family History History reviewed. No pertinent family history.  Social History Social History   Tobacco Use  . Smoking status: Never Smoker  . Smokeless tobacco: Never Used  Substance Use Topics  . Alcohol use: No  . Drug use: No     Allergies   Patient has no known allergies.   Review of Systems Review of Systems  Skin: Positive for rash.  All other systems reviewed and are negative.    Physical Exam Triage Vital Signs ED Triage Vitals  Enc Vitals Group     BP 07/06/20 1014 119/75     Pulse Rate 07/06/20 1014 93     Resp 07/06/20 1014 18     Temp 07/06/20 1014 97.7 F (36.5 C)     Temp Source 07/06/20 1014 Oral     SpO2 07/06/20 1014 98 %     Weight --      Height --      Head Circumference --      Peak  Flow --      Pain Score 07/06/20 1013 2     Pain Loc --      Pain Edu? --      Excl. in GC? --    No data found.  Updated Vital Signs BP 119/75 (BP Location: Right Arm)   Pulse 93   Temp 97.7 F (36.5 C) (Oral)   Resp 18   LMP  (Within Weeks) Comment: 2 weeks  SpO2 98%   Visual Acuity Right Eye Distance:   Left Eye Distance:   Bilateral Distance:    Right Eye Near:   Left Eye Near:    Bilateral Near:     Physical Exam Vitals reviewed.  Constitutional:      Appearance: Normal appearance.  HENT:     Head: Normocephalic and atraumatic.  Cardiovascular:     Rate and Rhythm: Normal rate and regular rhythm.     Heart sounds: Normal heart sounds.  Pulmonary:     Effort: Pulmonary effort is normal.     Breath sounds: Normal breath sounds.  Skin:    Capillary Refill: Capillary refill takes less than 2 seconds.     Comments: Diffuse urticarial rash with few patches on posterior neck, chest, arms, legs.  No facial rash, lip swelling, pharyngeal swelling.  Neurological:     General: No focal deficit present.     Mental Status: She is alert and oriented to  person, place, and time.  Psychiatric:        Mood and Affect: Mood normal.        Behavior: Behavior normal.        Thought Content: Thought content normal.        Judgment: Judgment normal.      UC Treatments / Results  Labs (all labs ordered are listed, but only abnormal results are displayed) Labs Reviewed - No data to display  EKG   Radiology No results found.  Procedures Procedures (including critical care time)  Medications Ordered in UC Medications - No data to display  Initial Impression / Assessment and Plan / UC Course  I have reviewed the triage vital signs and the nursing notes.  Pertinent labs & imaging results that were available during my care of the patient were reviewed by me and considered in my medical decision making (see chart for details).     This patient is a 30 year old female presenting with contact of otitis. Today this pt is afebrile nontachycardic nontachypneic, oxygenating well on room air, no wheezes rhonchi or rales.   Prednisone taper sent as below.  She is not a diabetic. Continue Benadryl for symptomatic relief  ED return precautions discussed.  This chart was dictated using voice recognition software, Dragon. Despite the best efforts of this provider to proofread and correct errors, errors may still occur which can change documentation meaning.   Final Clinical Impressions(s) / UC Diagnoses   Final diagnoses:  Allergic contact dermatitis, unspecified trigger     Discharge Instructions     -Start the prednisone taper for your rash. -You can continue Benadryl for symptomatic relief. -Seek additional medical attention if you experience worsening of symptoms despite treatment, or new symptoms like facial swelling, shortness of breath.    ED Prescriptions    Medication Sig Dispense Auth. Provider   predniSONE (STERAPRED UNI-PAK 21 TAB) 10 MG (21) TBPK tablet Take by mouth daily. Take 6 tabs by mouth daily  for 2 days, then 5  tabs for 2 days, then 4  tabs for 2 days, then 3 tabs for 2 days, 2 tabs for 2 days, then 1 tab by mouth daily for 2 days 42 tablet Rhys Martini, PA-C     PDMP not reviewed this encounter.   Rhys Martini, PA-C 07/06/20 1042

## 2020-07-06 NOTE — Discharge Instructions (Signed)
-  Start the prednisone taper for your rash. -You can continue Benadryl for symptomatic relief. -Seek additional medical attention if you experience worsening of symptoms despite treatment, or new symptoms like facial swelling, shortness of breath.

## 2020-07-06 NOTE — ED Triage Notes (Signed)
Pt reports rash in arms and chest x 5 days, right arm pain x 3 days. Benadryl gives relief.

## 2021-08-15 ENCOUNTER — Ambulatory Visit (HOSPITAL_COMMUNITY)
Admission: EM | Admit: 2021-08-15 | Discharge: 2021-08-15 | Disposition: A | Discharge status: Home / Self Care | Payer: Managed Care, Other (non HMO) | Attending: Family Medicine | Admitting: Family Medicine

## 2021-08-15 ENCOUNTER — Ambulatory Visit: Admit: 2021-08-15 | Discharge status: Absent without leave | Payer: Managed Care, Other (non HMO)

## 2021-08-15 ENCOUNTER — Other Ambulatory Visit: Payer: Self-pay

## 2021-08-15 ENCOUNTER — Encounter (HOSPITAL_COMMUNITY): Payer: Self-pay | Admitting: *Deleted

## 2021-08-15 DIAGNOSIS — H6693 Otitis media, unspecified, bilateral: Secondary | ICD-10-CM

## 2021-08-15 DIAGNOSIS — H6690 Otitis media, unspecified, unspecified ear: Secondary | ICD-10-CM

## 2021-08-15 DIAGNOSIS — H60503 Unspecified acute noninfective otitis externa, bilateral: Secondary | ICD-10-CM

## 2021-08-15 MED ORDER — NEOMYCIN-POLYMYXIN-HC 3.5-10000-1 OT SOLN: 4.0000 [drp] | Freq: Four times a day (QID) | OTIC | 0 refills | Status: AC

## 2021-08-15 MED ORDER — AMOXICILLIN 875 MG PO TABS: 875.0000 mg | ORAL_TABLET | Freq: Two times a day (BID) | ORAL | 0 refills | Status: AC

## 2021-08-15 MED ORDER — FLUCONAZOLE 150 MG PO TABS: 150.0000 mg | ORAL_TABLET | Freq: Once | ORAL | 0 refills | Status: AC

## 2021-08-15 NOTE — ED Triage Notes (Signed)
Pt reports L ear pain x 1 month starts as itchy on /off. Painful when opening jaw. ?

## 2021-08-15 NOTE — Discharge Instructions (Addendum)
Take amoxicillin 875 mg--1 tab twice daily for 7 days  ? ?Cortisporin eardrops--3 to 4 drops in both ears 4 times daily for 7 days ? ?If you develop a yeast infection on the antibiotics, you can take fluconazole 150 mg once; you can repeat it in a week if symptoms return or persist ?

## 2021-08-15 NOTE — ED Provider Notes (Signed)
?[facility] ? ? ? ?CSN: [id number] ?Arrival date & time: 08/15/21  1142 ? ? ?  ? ?History   ?Chief Complaint ?Chief Complaint  ?Patient presents with  ? Otalgia  ? ? ?HPI ?Dorothy [name] is a 31 y.o. female.  ? ? ?Otalgia ?Here for left ear pain that is been going on for about a month.  It also is hurting a little bit in her right ear.  No fever or chills, no upper respiratory symptoms, and no vomiting or diarrhea. ? ?The left ear is itchy a little bit, but she has not noted any discharge.  The external ear is a little tender. ? ?Past Medical History:  ?Diagnosis Date  ? Medical history non-contributory   ? ? ?Patient Active Problem List  ? Diagnosis Date Noted  ? Active labor 08/06/2015  ? Post term pregnancy over 40 weeks 06/19/2013  ? ? ?Past Surgical History:  ?Procedure Laterality Date  ? NO PAST SURGERIES    ? ? ?OB History   ? ? Gravida  ?2  ? Para  ?2  ? Term  ?2  ? Preterm  ?   ? AB  ?   ? Living  ?2  ?  ? ? SAB  ?   ? IAB  ?   ? Ectopic  ?   ? Multiple  ?0  ? Live Births  ?2  ?   ?  ?  ? ? ? ?Home Medications   ? ?Prior to Admission medications   ?Medication Sig Start Date End Date Taking? Authorizing Provider  ?amoxicillin (AMOXIL) 875 MG tablet Take 1 tablet (875 mg total) by mouth 2 (two) times daily for 7 days. 08/15/21 08/22/21 Yes Sinead Hockman, Janace Aris, MD  ?neomycin-polymyxin-hydrocortisone (CORTISPORIN) OTIC solution Place 4 drops into both ears 4 (four) times daily for 7 days. 08/15/21 08/22/21 Yes Zenia Resides, MD  ?diphenhydrAMINE (BENADRYL) 50 MG tablet Take 50 mg by mouth at bedtime as needed for itching.    [provider]  ?fluconazole (DIFLUCAN) 150 MG tablet Take 1 tablet (150 mg total) by mouth once for 1 dose. Repeat in 1 week if needed 08/15/21 08/15/21  Zenia Resides, MD  ?ibuprofen (ADVIL,MOTRIN) 600 MG tablet Take 1 tablet (600 mg total) by mouth every 6 (six) hours as needed. 08/07/15   Arabella Merles, CNM  ?Multiple Vitamin (MULTIVITAMIN) tablet Take 1  tablet by mouth daily.    [provider]  ?Prenatal Vit-Fe Fumarate-FA (PRENATAL MULTIVITAMIN) TABS tablet Take 1 tablet by mouth daily at 12 noon.    [provider]  ? ? ?Family History ?History reviewed. No pertinent family history. ? ?Social History ?Social History  ? ?Tobacco Use  ? Smoking status: Never  ? Smokeless tobacco: Never  ?Substance Use Topics  ? Alcohol use: No  ? Drug use: No  ? ? ? ?Allergies   ?Patient has no known allergies. ? ? ?Review of Systems ?Review of Systems  ?HENT:  Positive for ear pain.   ? ? ?Physical Exam ?Triage Vital Signs ?ED Triage Vitals  ?Enc Vitals Group  ?   BP 08/15/21 1152 104/75  ?   Pulse Rate 08/15/21 1152 75  ?   Resp --   ?   Temp 08/15/21 1152 98.5 ?F (36.9 ?C)  ?   Temp Source 08/15/21 1152 Oral  ?   SpO2 08/15/21 1152 98 %  ?   Weight --   ?   Height --   ?  Head Circumference --   ?   Peak Flow --   ?   Pain Score 08/15/21 1157 5  ?   Pain Loc --   ?   Pain Edu? --   ?   Excl. in GC? --   ? ?No data found. ? ?Updated Vital Signs ?BP 104/75 (BP Location: Left Arm)   Pulse 75   Temp 98.5 ?F (36.9 ?C) (Oral)   LMP 07/27/2021   SpO2 98%  ? ?Visual Acuity ?Right Eye Distance:   ?Left Eye Distance:   ?Bilateral Distance:   ? ?Right Eye Near:   ?Left Eye Near:    ?Bilateral Near:    ? ?Physical Exam ?Vitals reviewed.  ?Constitutional:   ?   General: She is not in acute distress. ?   Appearance: She is not toxic-appearing.  ?HENT:  ?   Ears:  ?   Comments: Left ear canal is swollen with white adherent discharge.  The tympanic membrane is obscured some by the swelling, but does appear gray with some white adherent discharge on it.  There is pain on traction of the left pinna ? ?Right ear canal is also mildly swollen but not as much.  That tympanic membrane is pink and dull. ?   Nose: Nose normal.  ?   Mouth/Throat:  ?   Mouth: Mucous membranes are moist.  ?   Pharynx: No oropharyngeal exudate or posterior oropharyngeal erythema.  ?Eyes:  ?    Extraocular Movements: Extraocular movements intact.  ?   Conjunctiva/sclera: Conjunctivae normal.  ?   Pupils: Pupils are equal, round, and reactive to light.  ?Cardiovascular:  ?   Rate and Rhythm: Normal rate and regular rhythm.  ?   Heart sounds: No murmur heard. ?Pulmonary:  ?   Effort: Pulmonary effort is normal. No respiratory distress.  ?   Breath sounds: No wheezing, rhonchi or rales.  ?Chest:  ?   Chest wall: No tenderness.  ?Musculoskeletal:  ?   Cervical back: Neck supple.  ?Lymphadenopathy:  ?   Cervical: No cervical adenopathy.  ?Skin: ?   Capillary Refill: Capillary refill takes less than 2 seconds.  ?   Coloration: Skin is not jaundiced or pale.  ?Neurological:  ?   General: No focal deficit present.  ?   Mental Status: She is alert and oriented to person, place, and time.  ?Psychiatric:     ?   Behavior: Behavior normal.  ? ? ? ?UC Treatments / Results  ?Labs ?(all labs ordered are listed, but only abnormal results are displayed) ?Labs Reviewed - No data to display ? ?EKG ? ? ?Radiology ?No results found. ? ?Procedures ?Procedures (including critical care time) ? ?Medications Ordered in UC ?Medications - No data to display ? ?Initial Impression / Assessment and Plan / UC Course  ?I have reviewed the triage vital signs and the nursing notes. ? ?Pertinent labs & imaging results that were available during my care of the patient were reviewed by me and considered in my medical decision making (see chart for details). ? ?  ? ?I am going to treat for both otitis externa which I do think is causing the predominance of her symptoms and also for otitis media. ?Final Clinical Impressions(s) / UC Diagnoses  ? ?Final diagnoses:  ?Acute otitis externa of both ears, unspecified type  ?Acute otitis media, unspecified otitis media type  ? ? ? ?Discharge Instructions   ? ?  ?Take amoxicillin 875 mg--1 tab twice daily  for 7 days  ? ?Cortisporin eardrops--3 to 4 drops in both ears 4 times daily for 7 days ? ?If you  develop a yeast infection on the antibiotics, you can take fluconazole 150 mg once; you can repeat it in a week if symptoms return or persist ? ? ? ? ?ED Prescriptions   ? ? Medication Sig Dispense Auth. Provider  ? amoxicillin (AMOXIL) 875 MG tablet Take 1 tablet (875 mg total) by mouth 2 (two) times daily for 7 days. 14 tablet Kharson Rasmusson, Janace Aris, MD  ? neomycin-polymyxin-hydrocortisone (CORTISPORIN) OTIC solution Place 4 drops into both ears 4 (four) times daily for 7 days. 10 mL Zenia Resides, MD  ? fluconazole (DIFLUCAN) 150 MG tablet Take 1 tablet (150 mg total) by mouth once for 1 dose. Repeat in 1 week if needed 2 tablet Marlinda Mike, Janace Aris, MD  ? ?  ? ?PDMP not reviewed this encounter. ?  ?Zenia Resides, MD ?08/15/21 1245 ? ?

## 2022-01-31 LAB — OB RESULTS CONSOLE HIV ANTIBODY (ROUTINE TESTING): HIV: NONREACTIVE

## 2022-01-31 LAB — OB RESULTS CONSOLE RPR: RPR: NONREACTIVE

## 2022-01-31 LAB — OB RESULTS CONSOLE HEPATITIS B SURFACE ANTIGEN: Hepatitis B Surface Ag: NEGATIVE

## 2022-01-31 LAB — OB RESULTS CONSOLE RUBELLA ANTIBODY, IGM: Rubella: IMMUNE

## 2022-01-31 LAB — OB RESULTS CONSOLE ABO/RH: RH Type: POSITIVE

## 2022-01-31 LAB — HEPATITIS C ANTIBODY: HCV Ab: NEGATIVE

## 2022-01-31 LAB — OB RESULTS CONSOLE ANTIBODY SCREEN: Antibody Screen: NEGATIVE

## 2022-02-09 LAB — OB RESULTS CONSOLE GC/CHLAMYDIA
Chlamydia: NEGATIVE
Neisseria Gonorrhea: NEGATIVE

## 2022-05-26 ENCOUNTER — Other Ambulatory Visit: Payer: Self-pay | Admitting: Obstetrics and Gynecology

## 2022-05-26 DIAGNOSIS — Z363 Encounter for antenatal screening for malformations: Secondary | ICD-10-CM

## 2022-05-31 ENCOUNTER — Encounter: Payer: Self-pay | Admitting: *Deleted

## 2022-06-02 ENCOUNTER — Ambulatory Visit: Payer: Managed Care, Other (non HMO) | Attending: Obstetrics and Gynecology

## 2022-06-02 ENCOUNTER — Ambulatory Visit: Payer: Managed Care, Other (non HMO)

## 2022-06-02 ENCOUNTER — Encounter: Payer: Self-pay | Admitting: *Deleted

## 2022-06-02 VITALS — BP 120/64 | HR 92

## 2022-06-02 DIAGNOSIS — Z363 Encounter for antenatal screening for malformations: Secondary | ICD-10-CM | POA: Diagnosis not present

## 2022-06-02 DIAGNOSIS — Z3689 Encounter for other specified antenatal screening: Secondary | ICD-10-CM | POA: Diagnosis present

## 2022-08-10 LAB — OB RESULTS CONSOLE GBS: GBS: NEGATIVE

## 2022-08-19 ENCOUNTER — Telehealth (HOSPITAL_COMMUNITY): Payer: Self-pay | Admitting: *Deleted

## 2022-08-19 NOTE — Telephone Encounter (Signed)
Preadmission screen  

## 2022-08-22 ENCOUNTER — Encounter (HOSPITAL_COMMUNITY): Payer: Self-pay | Admitting: *Deleted

## 2022-08-30 ENCOUNTER — Encounter (HOSPITAL_COMMUNITY): Payer: Self-pay | Admitting: Obstetrics and Gynecology

## 2022-08-30 ENCOUNTER — Inpatient Hospital Stay (HOSPITAL_COMMUNITY): Payer: Managed Care, Other (non HMO)

## 2022-08-30 ENCOUNTER — Inpatient Hospital Stay (HOSPITAL_COMMUNITY)
Admission: AD | Admit: 2022-08-30 | Discharge: 2022-08-31 | DRG: 807 | Disposition: A | Discharge status: Home / Self Care | Payer: Managed Care, Other (non HMO) | Attending: Obstetrics and Gynecology | Admitting: Obstetrics and Gynecology

## 2022-08-30 DIAGNOSIS — Z3A39 39 weeks gestation of pregnancy: Secondary | ICD-10-CM

## 2022-08-30 DIAGNOSIS — O24420 Gestational diabetes mellitus in childbirth, diet controlled: Principal | ICD-10-CM | POA: Diagnosis present

## 2022-08-30 DIAGNOSIS — Z3490 Encounter for supervision of normal pregnancy, unspecified, unspecified trimester: Secondary | ICD-10-CM

## 2022-08-30 LAB — GLUCOSE, CAPILLARY
Glucose-Capillary: 103 mg/dL — ABNORMAL HIGH (ref 70–99)
Glucose-Capillary: 73 mg/dL (ref 70–99)
Glucose-Capillary: 80 mg/dL (ref 70–99)

## 2022-08-30 LAB — CBC
HCT: 38.9 % (ref 36.0–46.0)
Hemoglobin: 13.7 g/dL (ref 12.0–15.0)
MCH: 29.9 pg (ref 26.0–34.0)
MCHC: 35.2 g/dL (ref 30.0–36.0)
MCV: 84.9 fL (ref 80.0–100.0)
Platelets: 150 10*3/uL (ref 150–400)
RBC: 4.58 MIL/uL (ref 3.87–5.11)
RDW: 13.9 % (ref 11.5–15.5)
WBC: 5.6 10*3/uL (ref 4.0–10.5)
nRBC: 0 % (ref 0.0–0.2)

## 2022-08-30 LAB — TYPE AND SCREEN
ABO/RH(D): A POS
Antibody Screen: NEGATIVE

## 2022-08-30 LAB — RPR: RPR Ser Ql: NONREACTIVE

## 2022-08-30 MED ORDER — SOD CITRATE-CITRIC ACID 500-334 MG/5ML PO SOLN: 30.0000 mL | ORAL | Status: DC | PRN

## 2022-08-30 MED ORDER — TERBUTALINE SULFATE 1 MG/ML IJ SOLN
0.2500 mg | Freq: Once | INTRAMUSCULAR | Status: DC | PRN
  Filled 2022-08-30: qty 1

## 2022-08-30 MED ORDER — OXYCODONE-ACETAMINOPHEN 5-325 MG PO TABS: 1.0000 | ORAL_TABLET | ORAL | Status: DC | PRN

## 2022-08-30 MED ORDER — OXYCODONE HCL 5 MG PO TABS: 10.0000 mg | ORAL_TABLET | ORAL | Status: DC | PRN

## 2022-08-30 MED ORDER — OXYCODONE HCL 5 MG PO TABS: 5.0000 mg | ORAL_TABLET | ORAL | Status: DC | PRN

## 2022-08-30 MED ORDER — OXYTOCIN-SODIUM CHLORIDE 30-0.9 UT/500ML-% IV SOLN
2.5000 [IU]/h | INTRAVENOUS | Status: DC
  Administered 2022-08-30: 2.5 [IU]/h via INTRAVENOUS
  Filled 2022-08-30: qty 500

## 2022-08-30 MED ORDER — DIPHENHYDRAMINE HCL 25 MG PO CAPS: 25.0000 mg | ORAL_CAPSULE | Freq: Four times a day (QID) | ORAL | Status: DC | PRN

## 2022-08-30 MED ORDER — ONDANSETRON HCL 4 MG/2ML IJ SOLN: 4.0000 mg | Freq: Four times a day (QID) | INTRAMUSCULAR | Status: DC | PRN

## 2022-08-30 MED ORDER — IBUPROFEN 600 MG PO TABS
600.0000 mg | ORAL_TABLET | Freq: Four times a day (QID) | ORAL | Status: DC
  Administered 2022-08-30 – 2022-08-31 (×4): 600 mg via ORAL
  Filled 2022-08-30 (×4): qty 1

## 2022-08-30 MED ORDER — LIDOCAINE HCL (PF) 1 % IJ SOLN
30.0000 mL | INTRAMUSCULAR | Status: AC | PRN
  Administered 2022-08-30: 30 mL via SUBCUTANEOUS
  Filled 2022-08-30: qty 30

## 2022-08-30 MED ORDER — OXYCODONE-ACETAMINOPHEN 5-325 MG PO TABS: 2.0000 | ORAL_TABLET | ORAL | Status: DC | PRN

## 2022-08-30 MED ORDER — SIMETHICONE 80 MG PO CHEW: 80.0000 mg | CHEWABLE_TABLET | ORAL | Status: DC | PRN

## 2022-08-30 MED ORDER — BENZOCAINE-MENTHOL 20-0.5 % EX AERO
1.0000 | INHALATION_SPRAY | CUTANEOUS | Status: DC | PRN
  Administered 2022-08-30: 1 via TOPICAL
  Filled 2022-08-30: qty 56

## 2022-08-30 MED ORDER — ZOLPIDEM TARTRATE 5 MG PO TABS: 5.0000 mg | ORAL_TABLET | Freq: Every evening | ORAL | Status: DC | PRN

## 2022-08-30 MED ORDER — LACTATED RINGERS IV SOLN: INTRAVENOUS | Status: DC

## 2022-08-30 MED ORDER — TETANUS-DIPHTH-ACELL PERTUSSIS 5-2.5-18.5 LF-MCG/0.5 IM SUSY: 0.5000 mL | PREFILLED_SYRINGE | Freq: Once | INTRAMUSCULAR | Status: DC

## 2022-08-30 MED ORDER — ONDANSETRON HCL 4 MG PO TABS: 4.0000 mg | ORAL_TABLET | ORAL | Status: DC | PRN

## 2022-08-30 MED ORDER — COCONUT OIL OIL: 1.0000 | TOPICAL_OIL | Status: DC | PRN

## 2022-08-30 MED ORDER — ONDANSETRON HCL 4 MG/2ML IJ SOLN: 4.0000 mg | INTRAMUSCULAR | Status: DC | PRN

## 2022-08-30 MED ORDER — OXYTOCIN BOLUS FROM INFUSION
333.0000 mL | Freq: Once | INTRAVENOUS | Status: AC
  Administered 2022-08-30: 333 mL via INTRAVENOUS

## 2022-08-30 MED ORDER — LACTATED RINGERS IV SOLN: 500.0000 mL | INTRAVENOUS | Status: DC | PRN

## 2022-08-30 MED ORDER — WITCH HAZEL-GLYCERIN EX PADS: 1.0000 | MEDICATED_PAD | CUTANEOUS | Status: DC | PRN

## 2022-08-30 MED ORDER — ACETAMINOPHEN 325 MG PO TABS: 650.0000 mg | ORAL_TABLET | ORAL | Status: DC | PRN

## 2022-08-30 MED ORDER — MISOPROSTOL 25 MCG QUARTER TABLET
25.0000 ug | ORAL_TABLET | ORAL | Status: DC | PRN
  Administered 2022-08-30: 25 ug via VAGINAL
  Filled 2022-08-30: qty 1

## 2022-08-30 MED ORDER — HYDROXYZINE HCL 50 MG PO TABS: 50.0000 mg | ORAL_TABLET | Freq: Four times a day (QID) | ORAL | Status: DC | PRN

## 2022-08-30 MED ORDER — PRENATAL MULTIVITAMIN CH
1.0000 | ORAL_TABLET | Freq: Every day | ORAL | Status: DC
  Administered 2022-08-30 – 2022-08-31 (×2): 1 via ORAL
  Filled 2022-08-30 (×2): qty 1

## 2022-08-30 MED ORDER — DIBUCAINE (PERIANAL) 1 % EX OINT: 1.0000 | TOPICAL_OINTMENT | CUTANEOUS | Status: DC | PRN

## 2022-08-30 MED ORDER — SENNOSIDES-DOCUSATE SODIUM 8.6-50 MG PO TABS
2.0000 | ORAL_TABLET | ORAL | Status: DC
  Administered 2022-08-30 – 2022-08-31 (×2): 2 via ORAL
  Filled 2022-08-30 (×2): qty 2

## 2022-08-30 NOTE — Lactation Note (Signed)
This note was copied from a baby's chart. Lactation Consultation Note  Patient Name: Dorothy [name] [id number] Date: 08/30/2022 Age:30 hours  Reason for consult: Initial assessment;Term;Maternal endocrine disorder  P3, GA [redacted]w[redacted]d, GDM (diet controlled), first blood sugar 45, second is pending.  Mother reports baby Dorothy "Kara Mead" has latched and is breastfeeding well. Infant sleeping in mother's arms now. Encouraged mother to call nurse/ LC for assistance with breastfeeding, if needed.  Mother given a handout of O/P Lactation services, breastfeeding support groups, and our phone # for post-discharge questions.      Maternal Data Has patient been taught Hand Expression?: No Does the patient have breastfeeding experience prior to this delivery?: Yes How long did the patient breastfeed?: breast fed for 2-3 months with 2nd child, pumped up to 6 months with both (now 30yo and 30yo)  Feeding Mother's Current Feeding Choice: Breast Milk  Interventions Interventions: Education;LC Services brochure   Consult Status Consult Status: Follow-up Date: 08/31/22 Follow-up type: In-patient    Christella Hartigan M 08/30/2022, 4:52 PM

## 2022-08-30 NOTE — Progress Notes (Signed)
Delivery Note At 12:05 PM a viable child was delivered via Vaginal, Spontaneous (Presentation:   LOA   ).  APGAR: , ; weight  .   Placenta status:  intact,  .  Cord: 3 vessels  with the following complications:  none.  Cord pH:   Anesthesia:   Episiotomy: None Lacerations:  second degree laceration left anterior labia minora>repaired Suture Repair: 2.0 vicryl Est. Blood Loss (mL):  150  Mom to postpartum.  Baby to Couplet care / Skin to Skin.  [name] 08/30/2022, 12:21 PM

## 2022-08-30 NOTE — H&P (Signed)
Dorothy [name] is a 32 y.o. female presenting for IOL. Pregnancy complicated by A1GDM with good diet control. OB History     Gravida  3   Para  2   Term  2   Preterm      AB      Living  2      SAB      IAB      Ectopic      Multiple  0   Live Births  2          Past Medical History:  Diagnosis Date   Gestational diabetes    Medical history non-contributory    Past Surgical History:  Procedure Laterality Date   NO PAST SURGERIES     Family History: family history includes Diabetes in her paternal grandfather and paternal grandmother; Hypertension in her paternal grandfather and paternal grandmother. Social History:  reports that she has never smoked. She has never used smokeless tobacco. She reports that she does not drink alcohol and does not use drugs.     Maternal Diabetes: Yes:  Diabetes Type:  Diet controlled Genetic Screening: Declined  Maternal Ultrasounds/Referrals: Normal Fetal Ultrasounds or other Referrals:  None Maternal Substance Abuse:  No Significant Maternal Medications:  None Significant Maternal Lab Results:  Group B Strep negative Number of Prenatal Visits:greater than 3 verified prenatal visits Other Comments:  None  Review of Systems  Constitutional:  Negative for fever.  Eyes:  Negative for visual disturbance.  Neurological:  Negative for headaches.   Maternal Medical History:  Fetal activity: Perceived fetal activity is normal.     Dilation: 4 Effacement (%): 90 Station: -2 Exam by:: Bethanne Mule Blood pressure 115/83, pulse 64, temperature 97.7 F (36.5 C), temperature source Oral, height 5\' 1"  (1.549 m), weight 77.7 kg, last menstrual period 11/12/2021, unknown if currently breastfeeding. Maternal Exam:  Abdomen: Fetal presentation: vertex   Fetal Exam Fetal State Assessment: Category I - tracings are normal.   Physical Exam Cardiovascular:     Rate and Rhythm: Normal rate.  Pulmonary:     Effort:  Pulmonary effort is normal.     S/P misoprostol x 1>UCs SROM clear Cx 4/90/-2/vtx-clear AF  Prenatal labs: ABO, Rh: --/--/A POS (05/28 0015) Antibody: NEG (05/28 0015) Rubella: Immune (10/30 0000) RPR: Nonreactive (10/30 0000)  HBsAg: Negative (10/30 0000)  HIV: Non-reactive (10/30 0000)  GBS: Negative/-- (05/08 0000)   Assessment/Plan: 32 yo G3P2 @ 39 3/7 weeks entering active labor D/W pain relief options   [name] 08/30/2022, 7:24 AM

## 2022-08-31 ENCOUNTER — Other Ambulatory Visit: Payer: Self-pay

## 2022-08-31 ENCOUNTER — Ambulatory Visit (HOSPITAL_COMMUNITY): Payer: Self-pay

## 2022-08-31 LAB — BIRTH TISSUE RECOVERY COLLECTION (PLACENTA DONATION)

## 2022-08-31 LAB — CBC
HCT: 31.2 % — ABNORMAL LOW (ref 36.0–46.0)
Hemoglobin: 11 g/dL — ABNORMAL LOW (ref 12.0–15.0)
MCH: 29.6 pg (ref 26.0–34.0)
MCHC: 35.3 g/dL (ref 30.0–36.0)
MCV: 83.9 fL (ref 80.0–100.0)
Platelets: 131 10*3/uL — ABNORMAL LOW (ref 150–400)
RBC: 3.72 MIL/uL — ABNORMAL LOW (ref 3.87–5.11)
RDW: 14.1 % (ref 11.5–15.5)
WBC: 7 10*3/uL (ref 4.0–10.5)
nRBC: 0 % (ref 0.0–0.2)

## 2022-08-31 MED ORDER — SENNOSIDES-DOCUSATE SODIUM 8.6-50 MG PO TABS: 2.0000 | ORAL_TABLET | ORAL | 1 refills | Status: AC

## 2022-08-31 MED ORDER — ACETAMINOPHEN 325 MG PO TABS: 650.0000 mg | ORAL_TABLET | ORAL | 1 refills | Status: AC | PRN

## 2022-08-31 MED ORDER — IBUPROFEN 600 MG PO TABS: 600.0000 mg | ORAL_TABLET | Freq: Four times a day (QID) | ORAL | 0 refills | Status: AC

## 2022-08-31 NOTE — Lactation Note (Signed)
This note was copied from a baby's chart. Lactation Consultation Note  Patient Name: Girl Samanvitha Dorko [id number] Date: 08/31/2022 Age:30 hours  Reason for consult: Follow-up assessment;Maternal endocrine disorder;Infant weight loss  P3, GA [redacted]w[redacted]d, 6% weight loss within first 24 hours  Infant had 6% weight loss in first day of life and mother/ infant are working on feedings. Infant has been fed formula by bottle (up to 25 mL) today. RN started mother pumping and mother states she expressed a few drops. She reports that she has not latched baby to breast today.   Encouraged mother to continue to breast feed and follow breastfeeding with supplementing. Requested mother to call for next feeding and mother agreeable. Name left on white board.        Feeding Mother's Current Feeding Choice: Breast Milk and Formula Nipple Type: Extra Slow Flow  LATCH Score   Mother to call for LC to assess infant's latch and assist as needed.     Lactation Tools Discussed/Used Tools: Pump Breast pump type: Double-Electric Breast Pump Pump Education: Milk Storage;Setup, frequency, and cleaning Reason for Pumping: RN set up DEBP due to 6% weight loss Pumping frequency: every 3 hours for 15 min Pumped volume: 1 mL (per mother "few drops")  Interventions Interventions: Education  Discharge  Mother has a Spectra DEBP at home  Consult Status Consult Status: Follow-up Date: 09/01/22 Follow-up type: In-patient    Christella Hartigan M 08/31/2022, 3:59 PM

## 2022-08-31 NOTE — Discharge Summary (Signed)
Postpartum Discharge Summary  Date of Service updated 08/31/2022     Patient Name: Dorothy [name] DOB: Nov 14, 1990 MRN: [id number]  Date of admission: 08/30/2022 Delivery date:08/30/2022  Delivering provider: Harold Hedge  Date of discharge: 08/31/2022  Admitting diagnosis: Pregnancy [Z34.90] Term pregnancy [Z34.90] Intrauterine pregnancy: [redacted]w[redacted]d     Secondary diagnosis:  Principal Problem:   Pregnancy Active Problems:   Term pregnancy  Additional problems: GDMA1    Discharge diagnosis: Term Pregnancy Delivered and GDM A1                                              Post partum procedures: none Augmentation: Pitocin and Cytotec Complications: None  Hospital course: Induction of Labor With Vaginal Delivery   32 y.o. yo [phone] at [redacted]w[redacted]d was admitted to the hospital 08/30/2022 for induction of labor.  Indication for induction: A1 DM.  Patient had an labor course complicated by none. Membrane Rupture Time/Date: 6:40 AM ,08/30/2022   Delivery Method:Vaginal, Spontaneous  Episiotomy: None  Lacerations:  2nd degree;Labial  Details of delivery can be found in separate delivery note.  Patient had a postpartum course complicated by none. Patient is discharged home 08/31/22.  Newborn Data: Birth date:08/30/2022  Birth time:12:05 PM  Gender:Female  Living status:Living  Apgars:8 ,9  Weight:3720 g   Magnesium Sulfate received: No BMZ received: No Rhophylac:N/A MMR:No T-DaP:Given prenatally Flu: No Transfusion:No  Physical exam  Vitals:   08/30/22 1450 08/30/22 1826 08/30/22 2224 08/31/22 0304  BP: 114/79 104/64 101/75 97/71  Pulse: 90 96 89 86  Resp: 17 18 16 16   Temp: 98.2 F (36.8 C) 99 F (37.2 C) 98.2 F (36.8 C) 98.3 F (36.8 C)  TempSrc: Oral Oral Oral Oral  SpO2: 98% 97% 98% 98%  Weight:      Height:       General: alert, cooperative, and no distress Lochia: appropriate Uterine Fundus: firm Incision: N/A DVT Evaluation: No evidence of DVT seen  on physical exam. Labs: Lab Results  Component Value Date   WBC 7.0 08/31/2022   HGB 11.0 (L) 08/31/2022   HCT 31.2 (L) 08/31/2022   MCV 83.9 08/31/2022   PLT 131 (L) 08/31/2022       No data to display         [place] Score:    08/30/2022    1:50 PM  Edinburgh Postnatal Depression Scale Screening Tool  I have been able to laugh and see the funny side of things. 0  I have looked forward with enjoyment to things. 0  I have blamed myself unnecessarily when things went wrong. 1  I have been anxious or worried for no good reason. 0  I have felt scared or panicky for no good reason. 0  Things have been getting on top of me. 0  I have been so unhappy that I have had difficulty sleeping. 0  I have felt sad or miserable. 0  I have been so unhappy that I have been crying. 1  The thought of harming myself has occurred to me. 0  Edinburgh Postnatal Depression Scale Total 2      After visit meds:  Allergies as of 08/31/2022   No Known Allergies      Medication List     TAKE these medications    acetaminophen 325 MG tablet Commonly known as: Tylenol  Take 2 tablets (650 mg total) by mouth every 4 (four) hours as needed (for pain scale < 4).   ibuprofen 600 MG tablet Commonly known as: ADVIL Take 1 tablet (600 mg total) by mouth every 6 (six) hours.   prenatal multivitamin Tabs tablet Take 1 tablet by mouth daily at 12 noon.   senna-docusate 8.6-50 MG tablet Commonly known as: Senokot-S Take 2 tablets by mouth daily.         Discharge home in stable condition Infant Feeding: Bottle and Breast Infant Disposition:home with mother Discharge instruction: per After Visit Summary and Postpartum booklet. Activity: Advance as tolerated. Pelvic rest for 6 weeks.  Diet: routine diet Anticipated Birth Control: Unsure Postpartum Appointment:6 weeks Additional Postpartum F/U:  none Future Appointments:No future appointments.  08/31/2022 Dorothy Levy, MD

## 2022-09-01 ENCOUNTER — Ambulatory Visit (HOSPITAL_COMMUNITY): Payer: Self-pay

## 2022-09-01 NOTE — Lactation Note (Signed)
This note was copied from a baby's chart. Lactation Consultation Note  Patient Name: Dorothy [name] [id number] Date: 09/01/2022 Age:30 hours Reason for consult: Follow-up assessment;Term;Infant weight loss;Breastfeeding assistance;Maternal endocrine disorder (8.74% WL)  Infant was at 58 hours old.  LC entered the room and the birth parent was finishing up with formula feeding the infant.  Per the birth parent she has been pumping and getting drops.  LC checked the flange size for the birth parent and she needed a size #21.  The birth parent stated that she is having some tenderness when she first starts pumping that goes away right after the initial pull.  LC spoke with the birth parent about:  Engorgement Breast care Warning Signs Infant I/O Mastitis Outpatient lactation services Flange sizing Pumping Frequency Formula preparation.  All questions were answered.   Infant Feeding Plan:  Breastfeed 8+ times per day according to feeding cues.  Pump after feedings as needed or if the infant does not latch and feed the expressed milk to the infant via a bottle.  Watch infant output and call the pediatrician with questions or concerns.  Put the infant to the breast prior to supplementing.  Call the outpatient lactation consultant for assistance with breastfeeding.  Prioritize maternal rest, hydration, and nutrition.  Use ice rather than heat for engorgement.  Refer to the hand out on formula preparation.   Feeding Mother's Current Feeding Choice: Breast Milk and Formula  Lactation Tools Discussed/Used Flange Size: 21 Pumping frequency: q 3hrs  Interventions Interventions: Education  Discharge Discharge Education: Engorgement and breast care;Warning signs for feeding baby;Outpatient recommendation  Consult Status Consult Status: Follow-up Date: 09/02/22 Follow-up type: In-patient   Orvil Feil Kenard Morawski 09/01/2022, 10:23 AM

## 2022-09-08 ENCOUNTER — Telehealth (HOSPITAL_COMMUNITY): Payer: Self-pay | Admitting: *Deleted

## 2022-09-08 NOTE — Telephone Encounter (Signed)
09/08/2022  Name: Dorothy [name] MRN: [id number] DOB: 03/25/1991  Reason for Call:  Transition of Care Hospital Discharge Call  Contact Status: Patient Contact Status: Complete  Language assistant needed: None needed.         Follow-Up Questions: Do You Have Any Concerns About Your Health As You Heal From Delivery?: No Do You Have Any Concerns About Your Infants Health?: No  Edinburgh Postnatal Depression Scale:  In the Past 7 Days: EPDS not completed at this time. Patient stated, "I filled that out at the pediatrician's office. I am doing fine."    PHQ2-9 Depression Scale:     Discharge Follow-up: Edinburgh score requires follow up?: N/A Patient was advised of the following resources:: Breastfeeding Support Group, Support Group  Post-discharge interventions: Reviewed Newborn Safe Sleep Practices  Signature Deforest Hoyles, RN, [phone]
